# Patient Record
Sex: Female | Born: 1955 | Race: White | Hispanic: No | Marital: Single | Smoking: Never smoker
Health system: Southern US, Community
[De-identification: ages and names within clinical notes are randomized; demographics above are authoritative.]

## PROBLEM LIST (undated history)

## (undated) DIAGNOSIS — M810 Age-related osteoporosis without current pathological fracture: Secondary | ICD-10-CM

## (undated) HISTORY — DX: Age-related osteoporosis without current pathological fracture: M81.0

## (undated) HISTORY — PX: EXPLORATORY LAPAROTOMY: SUR591

---

## 2006-11-26 ENCOUNTER — Ambulatory Visit: Payer: Self-pay | Admitting: Internal Medicine

## 2006-11-26 LAB — CONVERTED CEMR LAB: Sed Rate: 11 mm/hr (ref 0–25)

## 2006-11-27 ENCOUNTER — Ambulatory Visit: Payer: Self-pay

## 2007-01-29 ENCOUNTER — Ambulatory Visit: Payer: Self-pay | Admitting: Internal Medicine

## 2007-04-05 ENCOUNTER — Ambulatory Visit: Payer: Self-pay | Admitting: Specialist

## 2007-11-23 ENCOUNTER — Emergency Department: Payer: Self-pay | Admitting: Emergency Medicine

## 2008-03-15 ENCOUNTER — Emergency Department: Payer: Self-pay | Admitting: Internal Medicine

## 2008-07-04 ENCOUNTER — Ambulatory Visit: Payer: Self-pay | Admitting: Family Medicine

## 2010-01-14 ENCOUNTER — Encounter: Admission: RE | Admit: 2010-01-14 | Discharge: 2010-01-14 | Payer: Self-pay | Admitting: Unknown Physician Specialty

## 2010-12-06 NOTE — Assessment & Plan Note (Signed)
[facility]                             PULMONARY OFFICE NOTE   NAME:CYKERTSuhani, [name]                         MRN:          [id number]  DATE:01/29/2007                            DOB:          September 05, 1955    HISTORY OF PRESENT ILLNESS:  The patient is a 55 year old white female  patient of Dr. Thurston Hole recently seen for pulmonary consult for a left  pleural effusion.  The patient has a history of severe endometriosis,  previously on hormone replacement therapy that was seen in May of this  year for an abrupt onset of left pleuritic pain associated with  shortness of breath.  A chest x-ray showed a small left pleural  effusion.  The patient was seen here in the office on Nov 26, 2006, by  Dr. Sherene Sires.  Chest x-ray at that time showed no evidence of a significant  effusion.  Lab work revealed a normal sed rate at 11.  Venous Dopplers  of the lower extremities showed no evidence of a DVT.  There was some  mild left popliteal venous incompetence.  The patient returns today  reporting that over the weekend, that he had a pulling discomfort along  the left knee that has totally resolved at present.  The patient reports  she is very active, runs, does the elliptical and bikes on most days of  the week.  She denies any associated shortness of breath, chest pain,  calf pain, swelling, abdominal pain, nausea, vomiting, dizziness,  palpitations.  The patient was concerned that her knee was hurting and  wanted to make sure that she had not dissolved the blood clot.   PAST MEDICAL HISTORY:  Reviewed.   CURRENT MEDICATIONS:  Reviewed.   PHYSICAL EXAMINATION:  The patient is a pleasant female patient in no  acute distress.  She is afebrile with stable vital signs.  O2 saturation is 100% on room  air.  HEENT:  Nasal mucosa is pink and moist.  Posterior pharynx is clear.  TMs are normal.  NECK:  Supple without cervical adenopathy.  No JVD.  LUNG SOUNDS:  Clear to  auscultation bilaterally.  CARDIAC:  S1, S2 without murmur, rub, or gallop.  ABDOMEN:  Soft, nontender.  EXTREMITIES:  Warm without any calf tenderness, cyanosis, clubbing, or  edema.  Negative Homan sign.  Knee exam is unremarkable.  Range of  motion is normal.  No excessive fluid is noted.  Posterior popliteal  space without any notable swelling, tenderness.  The patient does have a  few small varicosities along the lower extremities.   IMPRESSION AND PLAN:  Left posterior knee pain, questionable etiology,  possibly muscular in nature.  I recommend that the patient use Tylenol  and/or ibuprofen as tolerated for 2-3 days.  Ice and elevation p.r.n.  If symptoms persist, the patient is to call back the office for followup  or contact her primary care physician for followup.  The patient does  not appear to have any other  associated symptoms; however, I told the patient if she develops any  chest pain, shortness of breath,  she is to contract our office for  followup or seek emergency services assistance.      Rubye Oaks, NP  Electronically Signed      Charlaine Dalton. Sherene Sires, MD, [facility]  Electronically Signed   TP/MedQ  DD: 02/01/2007  DT: 02/02/2007  Job #: [id number]
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
## 2011-09-20 DIAGNOSIS — N200 Calculus of kidney: Secondary | ICD-10-CM | POA: Insufficient documentation

## 2011-09-20 DIAGNOSIS — M810 Age-related osteoporosis without current pathological fracture: Secondary | ICD-10-CM | POA: Insufficient documentation

## 2011-09-20 HISTORY — DX: Calculus of kidney: N20.0

## 2014-04-22 DIAGNOSIS — M810 Age-related osteoporosis without current pathological fracture: Secondary | ICD-10-CM | POA: Insufficient documentation

## 2014-09-03 LAB — HM PAP SMEAR: HM Pap smear: NORMAL

## 2015-02-17 ENCOUNTER — Encounter: Payer: Self-pay | Admitting: Family Medicine

## 2015-02-17 ENCOUNTER — Ambulatory Visit (INDEPENDENT_AMBULATORY_CARE_PROVIDER_SITE_OTHER): Payer: BLUE CROSS/BLUE SHIELD | Admitting: Family Medicine

## 2015-02-17 VITALS — BP 118/82 | HR 58 | Temp 98.0°F | Ht 67.0 in | Wt 138.6 lb

## 2015-02-17 DIAGNOSIS — L237 Allergic contact dermatitis due to plants, except food: Secondary | ICD-10-CM | POA: Diagnosis not present

## 2015-02-17 MED ORDER — DESONIDE 0.05 % EX CREA: TOPICAL_CREAM | Freq: Two times a day (BID) | CUTANEOUS | Status: DC

## 2015-02-17 MED ORDER — TRIAMCINOLONE ACETONIDE 0.1 % EX CREA: 1.0000 "application " | TOPICAL_CREAM | Freq: Two times a day (BID) | CUTANEOUS | Status: DC

## 2015-02-17 NOTE — Progress Notes (Signed)
  BP 118/82 mmHg  Pulse 58  Temp(Src) 98 F (36.7 C)  Ht  (1.702 m)  Wt 138 lb 9.6 oz (62.869 kg)  BMI 21.70 kg/m2  SpO2 99%   Subjective:    Patient ID: [name], female    DOB: Jun 02, 1956, 59 y.o.   MRN: [id number]  HPI: [name] is a 59 y.o. female  Chief Complaint  Patient presents with  . Rash    on face and neck.    RASH Duration:  yesterday  Location: trunk and face  Itching: yes Burning: no Redness: yes Oozing: no Scaling: no Blisters: no Painful: no Fevers: no Change in detergents/soaps/personal care products: no Recent illness: no Recent travel:no History of same: yes Context: stable Alleviating factors: hydrocortisone cream Treatments attempted:hydrocortisone cream and lotion/moisturizer Shortness of breath: no  Throat/tongue swelling: no Myalgias/arthralgias: no  Relevant past medical, surgical, family and social history reviewed and updated as indicated. Interim medical history since our last visit reviewed. Allergies and medications reviewed and updated.  Review of Systems  Constitutional: Negative.   Respiratory: Negative.   Cardiovascular: Negative.   Skin: Negative.   Psychiatric/Behavioral: Negative.     Per HPI unless specifically indicated above     Objective:    BP 118/82 mmHg  Pulse 58  Temp(Src) 98 F (36.7 C)  Ht  (1.702 m)  Wt 138 lb 9.6 oz (62.869 kg)  BMI 21.70 kg/m2  SpO2 99%  Wt Readings from Last 3 Encounters:  02/17/15 138 lb 9.6 oz (62.869 kg)  10/15/14 142 lb (64.411 kg)    Physical Exam  Constitutional: She is oriented to person, place, and time. She appears well-developed and well-nourished. No distress.  HENT:  Head: Normocephalic and atraumatic.  Right Ear: Hearing normal.  Left Ear: Hearing normal.  Nose: Nose normal.  Eyes: Conjunctivae and lids are normal. Right eye exhibits no discharge. Left eye exhibits no discharge. No scleral icterus.  Pulmonary/Chest: Effort normal. No  respiratory distress.  Musculoskeletal: Normal range of motion.  Neurological: She is alert and oriented to person, place, and time.  Skin: Skin is warm, dry and intact. No erythema. No pallor.  Maculopapular rash on the legs, face and anterior chest  Psychiatric: She has a normal mood and affect. Her speech is normal and behavior is normal. Judgment and thought content normal. Cognition and memory are normal.    Results for orders placed or performed in visit on 11/26/06  Converted [facility] Lab  Result Value Ref Range   Sed Rate 11 0-25 mm/hr      Assessment & Plan:   Problem List Items Addressed This Visit    None    Visit Diagnoses    Poison ivy    -  Primary    Mild case. Will treat with topical steroids. Call if not getting better or getting worse.         Follow up plan: Return if symptoms worsen or fail to improve.

## 2015-02-17 NOTE — Patient Instructions (Signed)

## 2015-05-28 ENCOUNTER — Telehealth: Payer: Self-pay | Admitting: Family Medicine

## 2015-05-28 ENCOUNTER — Ambulatory Visit (INDEPENDENT_AMBULATORY_CARE_PROVIDER_SITE_OTHER): Payer: BLUE CROSS/BLUE SHIELD | Admitting: Family Medicine

## 2015-05-28 ENCOUNTER — Encounter: Payer: Self-pay | Admitting: Family Medicine

## 2015-05-28 VITALS — BP 129/84 | HR 75 | Temp 98.5°F | Ht 67.0 in | Wt 147.0 lb

## 2015-05-28 DIAGNOSIS — S39012A Strain of muscle, fascia and tendon of lower back, initial encounter: Secondary | ICD-10-CM | POA: Insufficient documentation

## 2015-05-28 DIAGNOSIS — S46811A Strain of other muscles, fascia and tendons at shoulder and upper arm level, right arm, initial encounter: Secondary | ICD-10-CM

## 2015-05-28 DIAGNOSIS — S161XXA Strain of muscle, fascia and tendon at neck level, initial encounter: Secondary | ICD-10-CM

## 2015-05-28 DIAGNOSIS — S134XXA Sprain of ligaments of cervical spine, initial encounter: Secondary | ICD-10-CM | POA: Diagnosis not present

## 2015-05-28 MED ORDER — CYCLOBENZAPRINE HCL 10 MG PO TABS: 10.0000 mg | ORAL_TABLET | Freq: Three times a day (TID) | ORAL | Status: DC | PRN

## 2015-05-28 MED ORDER — NAPROXEN 375 MG PO TABS: 375.0000 mg | ORAL_TABLET | Freq: Two times a day (BID) | ORAL | Status: DC

## 2015-05-28 NOTE — Telephone Encounter (Signed)
Okay to write note Excuse for work tonight and possibly tomorrow
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
## 2015-05-28 NOTE — Telephone Encounter (Signed)
Pt called needs a work excuse for tonight as well as it being her choice to go into work tomorrow. Please call pt when note is ready for pick up.
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
## 2015-05-28 NOTE — Telephone Encounter (Signed)
Routing to provider  

## 2015-05-28 NOTE — Patient Instructions (Addendum)
Do not take any additional NSAIDs while on the prescription NSAID Use the cyclobenzaprine if needed ICE for the first 3 days, 15 minutes at a time, 3-4 times a day; always use cloth between ice and skin After first 3 days, then heat as above Okay for massage therapy for tight muscles Take aspirin one hour or more before the first naproxen of the day If you develop stomach pain, dark stools, worsening heartburn, stop NSAID and seek care right away Call if needed

## 2015-05-28 NOTE — Telephone Encounter (Signed)
Pt scheduled at 9 due to another cancelation

## 2015-05-31 NOTE — Telephone Encounter (Signed)
Letter done, patient notified   
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
## 2015-06-01 NOTE — Assessment & Plan Note (Signed)
Ice for first three days, then heat; muscle relaxants (cautions given), massage therapy, anti-inflammatories

## 2015-06-01 NOTE — Assessment & Plan Note (Signed)
With loss of cervical lordosis; will treat with muscle relaxers (caution given about not driving, may cause somnolence), anti-inflammatories; physical therapy

## 2015-06-01 NOTE — Assessment & Plan Note (Addendum)
Anti-inflammatories, topical thermal treatment (ice then heat), massage therapy may be helpful; contact me if not improving

## 2015-10-04 ENCOUNTER — Ambulatory Visit (INDEPENDENT_AMBULATORY_CARE_PROVIDER_SITE_OTHER): Payer: BLUE CROSS/BLUE SHIELD | Admitting: Family Medicine

## 2015-10-04 ENCOUNTER — Encounter: Payer: Self-pay | Admitting: Family Medicine

## 2015-10-04 VITALS — BP 126/83 | HR 64 | Temp 98.1°F | Ht 67.3 in | Wt 142.0 lb

## 2015-10-04 DIAGNOSIS — J069 Acute upper respiratory infection, unspecified: Secondary | ICD-10-CM | POA: Diagnosis not present

## 2015-10-04 DIAGNOSIS — M545 Low back pain, unspecified: Secondary | ICD-10-CM

## 2015-10-04 DIAGNOSIS — S39012A Strain of muscle, fascia and tendon of lower back, initial encounter: Secondary | ICD-10-CM | POA: Diagnosis not present

## 2015-10-04 LAB — URINALYSIS, ROUTINE W REFLEX MICROSCOPIC
Bilirubin, UA: NEGATIVE
Glucose, UA: NEGATIVE
Ketones, UA: NEGATIVE
Leukocytes, UA: NEGATIVE
Nitrite, UA: NEGATIVE
Protein, UA: NEGATIVE
Specific Gravity, UA: 1.02 (ref 1.005–1.030)
Urobilinogen, Ur: 0.2 mg/dL (ref 0.2–1.0)
pH, UA: 5.5 (ref 5.0–7.5)

## 2015-10-04 LAB — MICROSCOPIC EXAMINATION: WBC, UA: NONE SEEN /hpf (ref 0–?)

## 2015-10-04 NOTE — Progress Notes (Signed)
BP 126/83 mmHg  Pulse 64  Temp(Src) 98.1 F (36.7 C)  Ht 5' 7.3" (1.709 m)  Wt 142 lb (64.411 kg)  BMI 22.05 kg/m2  SpO2 99%   Subjective:    Patient ID: [name], female    DOB: 08/24/1955, 60 y.o.   MRN: [id number]  HPI: [name] is a 60 y.o. female  Chief Complaint  Patient presents with  . Back Pain    low, left side    Patient with flu symptoms started Tamiflu 2 days ago and is already feeling somewhat better but developed left lower back discomfort pain no radiation no rash in this area. No blood in stool or urine Concerned though may be some kidney related though has been related to development of cough and has had some pretty heavy coughing.  Relevant past medical, surgical, family and social history reviewed and updated as indicated. Interim medical history since our last visit reviewed. Allergies and medications reviewed and updated.  Review of Systems  Constitutional: Negative.   Respiratory: Negative.   Cardiovascular: Negative.     Per HPI unless specifically indicated above     Objective:    BP 126/83 mmHg  Pulse 64  Temp(Src) 98.1 F (36.7 C)  Ht 5' 7.3" (1.709 m)  Wt 142 lb (64.411 kg)  BMI 22.05 kg/m2  SpO2 99%  Wt Readings from Last 3 Encounters:  10/04/15 142 lb (64.411 kg)  05/28/15 147 lb (66.679 kg)  02/17/15 138 lb 9.6 oz (62.869 kg)    Physical Exam  Constitutional: She is oriented to person, place, and time. She appears well-developed and well-nourished. No distress.  HENT:  Head: Normocephalic and atraumatic.  Right Ear: Hearing and external ear normal.  Left Ear: Hearing and external ear normal.  Nose: Nose normal.  Mouth/Throat: Oropharyngeal exudate present.  Eyes: Conjunctivae and lids are normal. Right eye exhibits no discharge. Left eye exhibits no discharge. No scleral icterus.  Neck: No thyromegaly present.  Cardiovascular: Normal rate, regular rhythm and normal heart sounds.   Pulmonary/Chest: Effort normal  and breath sounds normal. No respiratory distress.  Musculoskeletal: Normal range of motion.  Lymphadenopathy:    She has no cervical adenopathy.  Neurological: She is alert and oriented to person, place, and time.  Skin: Skin is intact. No rash noted.  Psychiatric: She has a normal mood and affect. Her speech is normal and behavior is normal. Judgment and thought content normal. Cognition and memory are normal.    Results for orders placed or performed in visit on 11/26/06  Converted [facility] Lab  Result Value Ref Range   Sed Rate 11 0-25 mm/hr      Assessment & Plan:   Problem List Items Addressed This Visit      Musculoskeletal and Integument   Sacroiliac strain    Discussed strain of left flank is likely from coughing this patient suspects discussed watching for rash with concern about shingles also with 2+ blood in urine will keep open mind about possibility of kidney stone.       Other Visit Diagnoses    Left-sided low back pain without sciatica    -  Primary    Relevant Orders    Urinalysis, Routine w reflex microscopic (not at [facility])    Upper respiratory infection        Improving with Tamiflu will continue    Relevant Medications    oseltamivir (TAMIFLU) 75 MG capsule        Follow up plan:  Return for As scheduled.

## 2015-10-04 NOTE — Assessment & Plan Note (Signed)
Discussed strain of left flank is likely from coughing this patient suspects discussed watching for rash with concern about shingles also with 2+ blood in urine will keep open mind about possibility of kidney stone.

## 2015-12-06 ENCOUNTER — Encounter: Payer: Self-pay | Admitting: Family Medicine

## 2015-12-06 ENCOUNTER — Ambulatory Visit (INDEPENDENT_AMBULATORY_CARE_PROVIDER_SITE_OTHER): Payer: BLUE CROSS/BLUE SHIELD | Admitting: Family Medicine

## 2015-12-06 VITALS — BP 114/76 | HR 62 | Temp 97.8°F | Ht 66.8 in | Wt 144.0 lb

## 2015-12-06 DIAGNOSIS — J0100 Acute maxillary sinusitis, unspecified: Secondary | ICD-10-CM

## 2015-12-06 MED ORDER — AMOXICILLIN-POT CLAVULANATE 875-125 MG PO TABS: 1.0000 | ORAL_TABLET | Freq: Two times a day (BID) | ORAL | Status: DC

## 2015-12-06 NOTE — Progress Notes (Signed)
BP 114/76 mmHg  Pulse 62  Temp(Src) 97.8 F (36.6 C)  Ht 5' 6.8" (1.697 m)  Wt 144 lb (65.318 kg)  BMI 22.68 kg/m2  SpO2 99%   Subjective:    Patient ID: [name], female    DOB: 25-Aug-1955, 60 y.o.   MRN: [id number]  HPI: [name] is a 60 y.o. female  Chief Complaint  Patient presents with  . Facial Pain    right side  Patient with several weeks of sinus pressure congestion and facial pain especially on right side into right upper molars concerned may possibly have some dental abscess issues going on but has more sloshing-type sensation when bends over with right facial maxillary sinus pain. Some low-grade fever no real body aches maybe some chills Aching ibuprofen and Tylenol.  Relevant past medical, surgical, family and social history reviewed and updated as indicated. Interim medical history since our last visit reviewed. Allergies and medications reviewed and updated.  Review of Systems  Constitutional: Positive for chills and fatigue. Negative for fever.  HENT: Positive for congestion, rhinorrhea, sinus pressure, sneezing and sore throat.   Respiratory: Negative.   Cardiovascular: Negative.     Per HPI unless specifically indicated above     Objective:    BP 114/76 mmHg  Pulse 62  Temp(Src) 97.8 F (36.6 C)  Ht 5' 6.8" (1.697 m)  Wt 144 lb (65.318 kg)  BMI 22.68 kg/m2  SpO2 99%  Wt Readings from Last 3 Encounters:  12/06/15 144 lb (65.318 kg)  10/04/15 142 lb (64.411 kg)  05/28/15 147 lb (66.679 kg)    Physical Exam  Constitutional: She is oriented to person, place, and time. She appears well-developed and well-nourished. No distress.  HENT:  Head: Normocephalic and atraumatic.  Right Ear: Hearing and external ear normal.  Left Ear: Hearing and external ear normal.  Nose: Nose normal.  Mouth/Throat: Oropharyngeal exudate present.  Eyes: Conjunctivae and lids are normal. Right eye exhibits no discharge. Left eye exhibits no discharge. No  scleral icterus.  Cardiovascular: Normal rate, regular rhythm and normal heart sounds.   Pulmonary/Chest: Effort normal and breath sounds normal. No respiratory distress.  Musculoskeletal: Normal range of motion.  Lymphadenopathy:    She has no cervical adenopathy.  Neurological: She is alert and oriented to person, place, and time.  Skin: Skin is intact. No rash noted.  Psychiatric: She has a normal mood and affect. Her speech is normal and behavior is normal. Judgment and thought content normal. Cognition and memory are normal.    Results for orders placed or performed in visit on 10/04/15  Microscopic Examination  Result Value Ref Range   WBC, UA None seen 0 -  5 /hpf   RBC, UA 3-10 (A) 0 -  2 /hpf   Epithelial Cells (non renal) 0-10 0 - 10 /hpf   Bacteria, UA Few None seen/Few  Urinalysis, Routine w reflex microscopic (not at [facility])  Result Value Ref Range   Specific Gravity, UA 1.020 1.005 - 1.030   pH, UA 5.5 5.0 - 7.5   Color, UA Yellow Yellow   Appearance Ur Clear Clear   Leukocytes, UA Negative Negative   Protein, UA Negative Negative/Trace   Glucose, UA Negative Negative   Ketones, UA Negative Negative   RBC, UA 2+ (A) Negative   Bilirubin, UA Negative Negative   Urobilinogen, Ur 0.2 0.2 - 1.0 mg/dL   Nitrite, UA Negative Negative   Microscopic Examination See below:  Assessment & Plan:   Problem List Items Addressed This Visit    None    Visit Diagnoses    Acute maxillary sinusitis, recurrence not specified    -  Primary    Discussed sinusitis care and treatment nasal rinse, Mucinex, Flonase, Nasacort, Tylenol sinus etc.    Relevant Medications    amoxicillin-clavulanate (AUGMENTIN) 875-125 MG tablet      Also discuss possibility of dental abscess which patient is aware of if teeth and jaw pain persists will follow up with Maurine Ministerennis.  Follow up plan: Return if symptoms worsen or fail to improve, for As scheduled.

## 2015-12-15 ENCOUNTER — Telehealth: Payer: Self-pay | Admitting: Family Medicine

## 2015-12-15 MED ORDER — AMOXICILLIN-POT CLAVULANATE 875-125 MG PO TABS: 1.0000 | ORAL_TABLET | Freq: Two times a day (BID) | ORAL | Status: DC

## 2015-12-15 NOTE — Telephone Encounter (Signed)
Having dental work on June 6, would like ABX

## 2015-12-15 NOTE — Telephone Encounter (Signed)
Pt called would like a call back from [facility] or Dr. Dossie Arbourrissman ASAP concerning her sinus infection. Thanks.

## 2016-05-23 ENCOUNTER — Telehealth: Payer: BLUE CROSS/BLUE SHIELD | Admitting: Physician Assistant

## 2016-05-23 DIAGNOSIS — R399 Unspecified symptoms and signs involving the genitourinary system: Secondary | ICD-10-CM

## 2016-05-23 MED ORDER — NITROFURANTOIN MONOHYD MACRO 100 MG PO CAPS: 100.0000 mg | ORAL_CAPSULE | Freq: Two times a day (BID) | ORAL | 0 refills | Status: DC

## 2016-05-23 NOTE — Progress Notes (Signed)

## 2016-07-06 ENCOUNTER — Encounter: Payer: Self-pay | Admitting: Family Medicine

## 2016-07-06 ENCOUNTER — Ambulatory Visit (INDEPENDENT_AMBULATORY_CARE_PROVIDER_SITE_OTHER): Payer: BLUE CROSS/BLUE SHIELD | Admitting: Family Medicine

## 2016-07-06 VITALS — BP 110/70 | HR 59 | Temp 98.1°F | Ht 66.0 in | Wt 149.6 lb

## 2016-07-06 DIAGNOSIS — N3001 Acute cystitis with hematuria: Secondary | ICD-10-CM

## 2016-07-06 DIAGNOSIS — J019 Acute sinusitis, unspecified: Secondary | ICD-10-CM

## 2016-07-06 DIAGNOSIS — R399 Unspecified symptoms and signs involving the genitourinary system: Secondary | ICD-10-CM | POA: Diagnosis not present

## 2016-07-06 LAB — MICROSCOPIC EXAMINATION
Epithelial Cells (non renal): NONE SEEN /hpf (ref 0–10)
WBC, UA: >30 /hpf — AB (ref 0–?)

## 2016-07-06 LAB — URINALYSIS, ROUTINE W REFLEX MICROSCOPIC
Bilirubin, UA: NEGATIVE
Ketones, UA: NEGATIVE
Nitrite, UA: POSITIVE — AB
Specific Gravity, UA: <1.005 — ABNORMAL LOW (ref 1.005–1.030)
Urobilinogen, Ur: 2 mg/dL — ABNORMAL HIGH (ref 0.2–1.0)
pH, UA: 5 (ref 5.0–7.5)

## 2016-07-06 MED ORDER — AMOXICILLIN 875 MG PO TABS: 875.0000 mg | ORAL_TABLET | Freq: Two times a day (BID) | ORAL | 0 refills | Status: DC

## 2016-07-06 NOTE — Progress Notes (Signed)
BP 110/70 (BP Location: Left Arm, Patient Position: Sitting, Cuff Size: Normal)   Pulse (!) 59   Temp 98.1 F (36.7 C)   Ht 5\' 6"  (1.676 m)   Wt 149 lb 9.6 oz (67.9 kg)   BMI 24.15 kg/m    Subjective:    Patient ID: [name], female    DOB: [birth year], 60 y.o.   MRN: [id number]  HPI: [name] is a 60 y.o. female  Chief Complaint  Patient presents with  . UTI Symptoms   Patient with frequency urgency dysuria is going on for over a day now. Started Azo which has helped somewhat strained her urine orange as expected. Patient also over the last 2 weeks his had some sinus had cold drainage used over-the-counter medications has kind of been getting better but still is present and ongoing. Relevant past medical, surgical, family and social history reviewed and updated as indicated. Interim medical history since our last visit reviewed. Allergies and medications reviewed and updated.  Review of Systems  Constitutional: Negative.   Respiratory: Negative.   Cardiovascular: Negative.     Per HPI unless specifically indicated above     Objective:    BP 110/70 (BP Location: Left Arm, Patient Position: Sitting, Cuff Size: Normal)   Pulse (!) 59   Temp 98.1 F (36.7 C)   Ht 5\' 6"  (1.676 m)   Wt 149 lb 9.6 oz (67.9 kg)   BMI 24.15 kg/m   Wt Readings from Last 3 Encounters:  07/06/16 149 lb 9.6 oz (67.9 kg)  12/06/15 144 lb (65.3 kg)  10/04/15 142 lb (64.4 kg)    Physical Exam  Constitutional: She is oriented to person, place, and time. She appears well-developed and well-nourished. No distress.  HENT:  Head: Normocephalic and atraumatic.  Right Ear: Hearing normal.  Left Ear: Hearing normal.  Nose: Nose normal.  Eyes: Conjunctivae and lids are normal. Right eye exhibits no discharge. Left eye exhibits no discharge. No scleral icterus.  Pulmonary/Chest: Effort normal. No respiratory distress.  Musculoskeletal: Normal range of motion.  Neurological: She is alert  and oriented to person, place, and time.  Skin: Skin is intact. No rash noted.  Psychiatric: She has a normal mood and affect. Her speech is normal and behavior is normal. Judgment and thought content normal. Cognition and memory are normal.    Results for orders placed or performed in visit on 10/04/15  Microscopic Examination  Result Value Ref Range   WBC, UA None seen 0 - 5 /hpf   RBC, UA 3-10 (A) 0 - 2 /hpf   Epithelial Cells (non renal) 0-10 0 - 10 /hpf   Bacteria, UA Few None seen/Few  Urinalysis, Routine w reflex microscopic ([facility])  Result Value Ref Range   Specific Gravity, UA 1.020 1.005 - 1.030   pH, UA 5.5 5.0 - 7.5   Color, UA Yellow Yellow   Appearance Ur Clear Clear   Leukocytes, UA Negative Negative   Protein, UA Negative Negative/Trace   Glucose, UA Negative Negative   Ketones, UA Negative Negative   RBC, UA 2+ (A) Negative   Bilirubin, UA Negative Negative   Urobilinogen, Ur 0.2 0.2 - 1.0 mg/dL   Nitrite, UA Negative Negative   Microscopic Examination See below:       Assessment & Plan:   Problem List Items Addressed This Visit    None    Visit Diagnoses    UTI symptoms    -  Primary  Relevant Orders   Urinalysis, Routine w reflex microscopic   Acute cystitis with hematuria       Discuss UTI care and treatment also sinusitis Will use Amoxil patient education continue OTC meds follow-up if needed   Acute sinusitis, recurrence not specified, unspecified location       Will treat for sinus infection also using Amoxil and over-the-counter medications.   Relevant Medications   amoxicillin (AMOXIL) 875 MG tablet       Follow up plan: Return if symptoms worsen or fail to improve.

## 2017-10-31 ENCOUNTER — Encounter: Payer: Self-pay | Admitting: Family Medicine

## 2017-10-31 ENCOUNTER — Ambulatory Visit: Payer: BLUE CROSS/BLUE SHIELD | Admitting: Family Medicine

## 2017-10-31 VITALS — BP 117/63 | HR 79 | Temp 98.3°F | Ht 66.0 in | Wt 156.2 lb

## 2017-10-31 DIAGNOSIS — J020 Streptococcal pharyngitis: Secondary | ICD-10-CM | POA: Diagnosis not present

## 2017-10-31 LAB — RAPID STREP SCREEN (MED CTR MEBANE ONLY): Strep Gp A Ag, IA W/Reflex: POSITIVE — AB

## 2017-10-31 MED ORDER — AMOXICILLIN-POT CLAVULANATE 875-125 MG PO TABS: 1.0000 | ORAL_TABLET | Freq: Two times a day (BID) | ORAL | 0 refills | Status: DC

## 2017-10-31 MED ORDER — LIDOCAINE VISCOUS 2 % MT SOLN: 5.0000 mL | OROMUCOSAL | 0 refills | Status: DC | PRN

## 2017-10-31 NOTE — Progress Notes (Signed)
   BP 117/63   Pulse 79   Temp 98.3 F (36.8 C) (Oral)   Ht 5\' 6"  (1.676 m)   Wt 156 lb 3.2 oz (70.9 kg)   SpO2 97%   BMI 25.21 kg/m    Subjective:    Patient ID: Concha SeLinda Harren, female    DOB: March 10, 1956, 62 y.o.   MRN: [id number]  HPI: Concha SeLinda Blancett is a 62 y.o. female  Chief Complaint  Patient presents with  . Sore Throat   Several days of sore throat, fever (102.8), fatigue. Denies cough, congestion, CP, SOB. Taking some ibuprofen and drinking lots of fluids . Works with children, several sick contacts lately.   Relevant past medical, surgical, family and social history reviewed and updated as indicated. Interim medical history since our last visit reviewed. Allergies and medications reviewed and updated.  Review of Systems  Per HPI unless specifically indicated above     Objective:    BP 117/63   Pulse 79   Temp 98.3 F (36.8 C) (Oral)   Ht 5\' 6"  (1.676 m)   Wt 156 lb 3.2 oz (70.9 kg)   SpO2 97%   BMI 25.21 kg/m   Wt Readings from Last 3 Encounters:  10/31/17 156 lb 3.2 oz (70.9 kg)  07/06/16 149 lb 9.6 oz (67.9 kg)  12/06/15 144 lb (65.3 kg)    Physical Exam  Constitutional: She is oriented to person, place, and time. She appears well-developed and well-nourished. No distress.  HENT:  Head: Atraumatic.  Right Ear: Tympanic membrane normal.  Left Ear: Tympanic membrane normal.  Mouth/Throat: Mucous membranes are normal. Posterior oropharyngeal edema and posterior oropharyngeal erythema present.  Eyes: Pupils are equal, round, and reactive to light. EOM are normal.  Neck: Normal range of motion. Neck supple.  Cardiovascular: Normal rate and normal heart sounds.  Abdominal: Soft. Bowel sounds are normal.  Lymphadenopathy:    She has cervical adenopathy.  Neurological: She is alert and oriented to person, place, and time.  Skin: Skin is warm and dry.  Psychiatric: She has a normal mood and affect. Her behavior is normal.  Nursing note and vitals  reviewed.   Results for orders placed or performed in visit on 10/31/17  Rapid Strep Screen ([facility])  Result Value Ref Range   Strep Gp A Ag, IA W/Reflex Positive (A) Negative      Assessment & Plan:   Problem List Items Addressed This Visit    None    Visit Diagnoses    Strep pharyngitis    -  Primary   Rapid strep pos, will tx with augmentin, viscous lidocaine, supportive care with salt water gargles, tylenol and ibuprofen prn. F/u if no improvement   Relevant Orders   Rapid Strep Screen ([facility] ONLY) (Completed)       Follow up plan: Return if symptoms worsen or fail to improve.

## 2017-11-03 NOTE — Patient Instructions (Signed)
Follow up as needed

## 2017-11-16 ENCOUNTER — Encounter: Payer: Self-pay | Admitting: Family Medicine

## 2017-11-16 ENCOUNTER — Ambulatory Visit: Payer: BLUE CROSS/BLUE SHIELD | Admitting: Family Medicine

## 2017-11-16 VITALS — BP 100/60 | HR 66 | Temp 98.7°F | Wt 152.1 lb

## 2017-11-16 DIAGNOSIS — Z0184 Encounter for antibody response examination: Secondary | ICD-10-CM | POA: Diagnosis not present

## 2017-11-16 DIAGNOSIS — R635 Abnormal weight gain: Secondary | ICD-10-CM | POA: Diagnosis not present

## 2017-11-16 NOTE — Progress Notes (Signed)
BP 100/60 (BP Location: Right Arm, Patient Position: Sitting, Cuff Size: Normal)   Pulse 66   Temp 98.7 F (37.1 C) (Oral)   Wt 152 lb 1.6 oz (69 kg)   SpO2 98%   BMI 24.55 kg/m    Subjective:    Patient ID: [name], female    DOB: 11-05-1955, 62 y.o.   MRN: [id number]  HPI: [name] is a 62 y.o. female  Chief Complaint  Patient presents with  . MMR Vaccination    Patient states she thinks she had MMR's as a child, but was told to get another because she's around children.  . Weight Gain    Patient wants to check her TSH because of her weight.   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 Thinks she had her vaccines as a child but not sure. Asymptomatic,   Has gained some weight over the past few years, despite exercising and eating well. Does feel like her diet may not be as good as it used to be but for the most part eats healthily. Worried about a thyroid issue. No past hx of thyroid issues. Denies any other sxs such as fatigue, constipation, depression, cold intolerance.   Past Medical History:  Diagnosis Date  . Osteoporosis    Social History   Socioeconomic History  . Marital status: Single    Spouse name: Not on file  . Number of children: Not on file  . Years of education: Not on file  . Highest education level: Not on file  Occupational History  . Not on file  Social Needs  . Financial resource strain: Not on file  . Food insecurity:    Worry: Not on file    Inability: Not on file  . Transportation needs:    Medical: Not on file    Non-medical: Not on file  Tobacco Use  . Smoking status: Never Smoker  . Smokeless tobacco: Never Used  Substance and Sexual Activity  . Alcohol use: No  . Drug use: No  . Sexual activity: Never  Lifestyle  . Physical activity:    Days per week: Not on file    Minutes  per session: Not on file  . Stress: Not on file  Relationships  . Social connections:    Talks on phone: Not on file    Gets together: Not on file    Attends religious service: Not on file    Active member of club or organization: Not on file    Attends meetings of clubs or organizations: Not on file    Relationship status: Not on file  . Intimate partner violence:    Fear of current or ex partner: Not on file    Emotionally abused: Not on file    Physically abused: Not on file    Forced sexual activity: Not on file  Other Topics Concern  . Not on file  Social History Narrative  . Not on file   Relevant past medical, surgical, family and social history reviewed and updated as indicated. Interim medical history since our last visit reviewed. Allergies and medications reviewed and updated.  Review of Systems  Per HPI unless specifically indicated above     Objective:    BP 100/60 (BP Location: Right Arm, Patient Position: Sitting, Cuff Size: Normal)  Pulse 66   Temp 98.7 F (37.1 C) (Oral)   Wt 152 lb 1.6 oz (69 kg)   SpO2 98%   BMI 24.55 kg/m   Wt Readings from Last 3 Encounters:  11/16/17 152 lb 1.6 oz (69 kg)  10/31/17 156 lb 3.2 oz (70.9 kg)  07/06/16 149 lb 9.6 oz (67.9 kg)    Physical Exam  Constitutional: She is oriented to person, place, and time. She appears well-developed and well-nourished. No distress.  HENT:  Head: Atraumatic.  Eyes: Pupils are equal, round, and reactive to light. Conjunctivae are normal.  Neck: Normal range of motion. Neck supple.  Cardiovascular: Normal rate and regular rhythm.  Pulmonary/Chest: Effort normal and breath sounds normal.  Musculoskeletal: Normal range of motion.  Neurological: She is alert and oriented to person, place, and time.  Skin: Skin is warm and dry.  Psychiatric: She has a normal mood and affect. Her behavior is normal.  Nursing note and vitals reviewed.   Results for orders placed or performed in visit on  11/16/17  Measles/Mumps/Rubella Immunity  Result Value Ref Range   Rubella Antibodies, IGG 8.00 Immune >0.99 index   RUBEOLA AB, IGG >300.0 Immune >29.9 AU/mL   MUMPS ABS, IGG 227.0 Immune >10.9 AU/mL  TSH  Result Value Ref Range   TSH 1.260 0.450 - 4.500 uIU/mL      Assessment & Plan:   Problem List Items Addressed This Visit    None    Visit Diagnoses    Weight gain    -  Primary   Will check TSH today for reassurance, but discussed with pt healthy BMI and minimal weight gain. Cont healthy lifestyle   Relevant Orders   TSH (Completed)   Immunity status testing       Will check titers for MMR immunity and vaccinate as indicated   Relevant Orders   Measles/Mumps/Rubella Immunity (Completed)      Follow up plan: Return for as scheduled.

## 2017-11-17 LAB — MEASLES/MUMPS/RUBELLA IMMUNITY
MUMPS ABS, IGG: 227 AU/mL (ref >10.9)
RUBEOLA AB, IGG: >300 AU/mL (ref >29.9)
Rubella Antibodies, IGG: 8 index (ref >0.99)

## 2017-11-17 LAB — TSH: TSH: 1.26 u[IU]/mL (ref 0.450–4.500)

## 2017-11-19 ENCOUNTER — Telehealth: Payer: Self-pay | Admitting: Family Medicine

## 2017-11-19 DIAGNOSIS — Z23 Encounter for immunization: Secondary | ICD-10-CM

## 2017-11-19 NOTE — Telephone Encounter (Signed)
Routing to provider  

## 2017-11-19 NOTE — Addendum Note (Signed)
Addended by: Roosvelt Maser E on: 11/19/2017 04:44 PM   Modules accepted: Orders

## 2017-11-19 NOTE — Patient Instructions (Signed)
Follow up as needed

## 2017-11-19 NOTE — Telephone Encounter (Signed)
Order placed

## 2017-11-19 NOTE — Telephone Encounter (Signed)
Patient called for lab results.  Read results as documented by provider.  Patient stated that she will be coming in for MMR vaccine. Please place order so patient does not have to wait when she decides to come in.

## 2017-12-12 ENCOUNTER — Ambulatory Visit: Payer: Self-pay | Admitting: *Deleted

## 2017-12-12 NOTE — Telephone Encounter (Signed)
Pt reports insect bites, working with pine needles this am. Left groin area with 4-5 bites and right area of waistline with "About 30 small, typical looking mosquito bites." Bites are red and itchy, left groin bites are raised "Like welts." Denies any SOB, diffuse redness. Home care advise given  per protocol. Instructed pt to call back if symptoms worsen, redness,itching does not resolve within 3 days. Pt made aware NT would alert Dr. Dossie Arbour  of episode.  Reason for Disposition . Itchy insect bite  Answer Assessment - Initial Assessment Questions 1. TYPE of INSECT: "What type of insect was it?"      Unsure 2. ONSET: "When did you get bitten?"      This am, working with pine needles 3. LOCATION: "Where is the insect bite located?"      Left groin and right waist area 4. REDNESS: "Is the area red or pink?" If so, ask "What size is area of redness?" (inches or cm). "When did the redness start?"     Like welts at left groin; "Typical mosquito bites" right waist area. 5. PAIN: "Is there any pain?" If so, ask: "How bad is it?"  (Scale 1-10; or mild, moderate, severe)     no 6. ITCHING: "Does it itch?" If so, ask: "How bad is the itch?"    - MILD: doesn't interfere with normal activities   - MODERATE-SEVERE: interferes with work, school, sleep, or other activities      Moderate-severe. 7. SWELLING: "How big is the swelling?" (inches, cm, or compare to coins)     Like welts at left groin. 8. OTHER SYMPTOMS: "Do you have any other symptoms?"  (e.g., difficulty breathing, hives)     no 9. PREGNANCY: "Is there any chance you are pregnant?" "When was your last menstrual period?"     no  Protocols used: INSECT BITE-A-AH

## 2017-12-19 LAB — HM MAMMOGRAPHY: HM Mammogram: NORMAL (ref 0–4)

## 2018-03-12 ENCOUNTER — Encounter: Payer: Self-pay | Admitting: Family Medicine

## 2018-03-12 ENCOUNTER — Ambulatory Visit: Payer: BLUE CROSS/BLUE SHIELD | Admitting: Family Medicine

## 2018-03-12 VITALS — BP 101/67 | HR 62 | Temp 98.5°F | Wt 153.0 lb

## 2018-03-12 DIAGNOSIS — L959 Vasculitis limited to the skin, unspecified: Secondary | ICD-10-CM

## 2018-03-12 NOTE — Progress Notes (Signed)
   BP 101/67 (BP Location: Left Arm, Patient Position: Sitting, Cuff Size: Normal)   Pulse 62   Temp 98.5 F (36.9 C)   Wt 153 lb (69.4 kg)   SpO2 98%   BMI 24.69 kg/m    Subjective:    Patient ID: [name], female    DOB: 22-Oct-1955, 62 y.o.   MRN: [id number]  HPI: Jeanne SeLinda Todt is a 62 y.o. female  Chief Complaint  Patient presents with  . Rash    bilateral lower legs, X 2 days,appeared after doing yard work, feels like a slight sunburn   Here today for discoloration of b/l lower legs that started about 2 days ago after mowing her lawn. Also happened about 2 weeks ago when mowing, and in the past while she was at the beach. Asymptomatic. Has not tried anything for it when it happens. No known coagulopathies, trauma, bug bites, recent travel, exposures to allergens.   Has a cousin with Factor V Leiden.   Relevant past medical, surgical, family and social history reviewed and updated as indicated. Interim medical history since our last visit reviewed. Allergies and medications reviewed and updated.  Review of Systems  Per HPI unless specifically indicated above     Objective:    BP 101/67 (BP Location: Left Arm, Patient Position: Sitting, Cuff Size: Normal)   Pulse 62   Temp 98.5 F (36.9 C)   Wt 153 lb (69.4 kg)   SpO2 98%   BMI 24.69 kg/m   Wt Readings from Last 3 Encounters:  03/12/18 153 lb (69.4 kg)  11/16/17 152 lb 1.6 oz (69 kg)  10/31/17 156 lb 3.2 oz (70.9 kg)    Physical Exam  Constitutional: She is oriented to person, place, and time. She appears well-developed and well-nourished. No distress.  HENT:  Head: Atraumatic.  Eyes: Conjunctivae and EOM are normal.  Neck: Normal range of motion.  Cardiovascular: Normal rate, regular rhythm, normal heart sounds and intact distal pulses.  Pulmonary/Chest: Effort normal and breath sounds normal.  Musculoskeletal: Normal range of motion.  Neurological: She is alert and oriented to person, place, and time.    Skin: Skin is warm and dry.  Petechial rash b/l lower legs   Psychiatric: She has a normal mood and affect. Her behavior is normal.  Nursing note and vitals reviewed.  Results for orders placed or performed in visit on 11/16/17  Measles/Mumps/Rubella Immunity  Result Value Ref Range   Rubella Antibodies, IGG 8.00 Immune >0.99 index   RUBEOLA AB, IGG >300.0 Immune >29.9 AU/mL   MUMPS ABS, IGG 227.0 Immune >10.9 AU/mL  TSH  Result Value Ref Range   TSH 1.260 0.450 - 4.500 uIU/mL      Assessment & Plan:   Problem List Items Addressed This Visit    None    Visit Diagnoses    Vasculitis of skin    -  Primary   Reassurance given. Will get CBC and CRP, pt concerned about an underlying condition esp given a cousin has Factor V. Elevate legs, NSAIDs prn.    Relevant Orders   CBC with Differential/Platelet   C-reactive protein       Follow up plan: Return for as scheduled.

## 2018-03-12 NOTE — Patient Instructions (Signed)
Follow up as scheduled.  

## 2018-03-13 LAB — CBC WITH DIFFERENTIAL/PLATELET
Basophils Absolute: 0 10*3/uL (ref 0.0–0.2)
Basos: 1 %
EOS (ABSOLUTE): 0.1 10*3/uL (ref 0.0–0.4)
Eos: 3 %
Hematocrit: 38.8 % (ref 34.0–46.6)
Hemoglobin: 13 g/dL (ref 11.1–15.9)
Immature Grans (Abs): 0 10*3/uL (ref 0.0–0.1)
Immature Granulocytes: 0 %
Lymphocytes Absolute: 0.9 10*3/uL (ref 0.7–3.1)
Lymphs: 29 %
MCH: 28.7 pg (ref 26.6–33.0)
MCHC: 33.5 g/dL (ref 31.5–35.7)
MCV: 86 fL (ref 79–97)
Monocytes Absolute: 0.3 10*3/uL (ref 0.1–0.9)
Monocytes: 10 %
Neutrophils Absolute: 1.9 10*3/uL (ref 1.4–7.0)
Neutrophils: 57 %
Platelets: 183 10*3/uL (ref 150–450)
RBC: 4.53 x10E6/uL (ref 3.77–5.28)
RDW: 14 % (ref 12.3–15.4)
WBC: 3.3 10*3/uL — ABNORMAL LOW (ref 3.4–10.8)

## 2018-03-13 LAB — C-REACTIVE PROTEIN: CRP: 2 mg/L (ref 0–10)

## 2018-03-14 ENCOUNTER — Other Ambulatory Visit: Payer: Self-pay | Admitting: Family Medicine

## 2018-03-14 DIAGNOSIS — R7989 Other specified abnormal findings of blood chemistry: Secondary | ICD-10-CM

## 2018-03-15 ENCOUNTER — Telehealth: Payer: Self-pay | Admitting: Family Medicine

## 2018-03-15 NOTE — Telephone Encounter (Signed)
Pt. Called to review her CBC "one more time."

## 2018-03-15 NOTE — Telephone Encounter (Signed)
Patient notified, was happy about all her other normal results. Will return around the first week of September for a repeat lab draw.  Routing to provider as Lorain ChildesFYI.

## 2018-03-15 NOTE — Telephone Encounter (Signed)
See below, I had released them to mychart when I received them. They're non-contributory to her current issue, but just to be safe we can recheck as the WBC count was just under normal limits to make sure it normalizes  Notes recorded by Particia NearingLane, Lataya Varnell Elizabeth, PA-C on 03/14/2018 at 1:24 PM EDT Your labs came back normal other than a slightly low WBC count which probably is unrelated/transient here. Let's have you just come in for a repeat blood test in a few weeks to check that again. I'll have an order in and you can just drop by for a lab draw.  Copied from CRM #[id number]. Topic: General - Other >> Mar 15, 2018 11:24 AM [name], [name] wrote: Pt is calling in wanting her lab results that were taken on  03/12/2018. Pt states she cant take any calls after 12:30pm . She states a detailed message can be left on her voicemail and she needs results by today   Cb# [phone]

## 2018-03-19 NOTE — Telephone Encounter (Signed)
Patient stated that was an old message and patient understands everything.  Will come in for repeat blood draw.

## 2018-04-22 ENCOUNTER — Other Ambulatory Visit: Payer: BLUE CROSS/BLUE SHIELD

## 2018-04-22 DIAGNOSIS — R7989 Other specified abnormal findings of blood chemistry: Secondary | ICD-10-CM

## 2018-04-23 LAB — CBC WITH DIFFERENTIAL/PLATELET
Basophils Absolute: 0 10*3/uL (ref 0.0–0.2)
Basos: 1 %
EOS (ABSOLUTE): 0.1 10*3/uL (ref 0.0–0.4)
Eos: 3 %
Hematocrit: 40 % (ref 34.0–46.6)
Hemoglobin: 13.1 g/dL (ref 11.1–15.9)
Immature Grans (Abs): 0 10*3/uL (ref 0.0–0.1)
Immature Granulocytes: 0 %
Lymphocytes Absolute: 1.4 10*3/uL (ref 0.7–3.1)
Lymphs: 32 %
MCH: 28.9 pg (ref 26.6–33.0)
MCHC: 32.8 g/dL (ref 31.5–35.7)
MCV: 88 fL (ref 79–97)
Monocytes Absolute: 0.3 10*3/uL (ref 0.1–0.9)
Monocytes: 7 %
Neutrophils Absolute: 2.6 10*3/uL (ref 1.4–7.0)
Neutrophils: 57 %
Platelets: 209 10*3/uL (ref 150–450)
RBC: 4.53 x10E6/uL (ref 3.77–5.28)
RDW: 13.9 % (ref 12.3–15.4)
WBC: 4.4 10*3/uL (ref 3.4–10.8)

## 2018-05-14 ENCOUNTER — Ambulatory Visit: Payer: BLUE CROSS/BLUE SHIELD | Admitting: Nurse Practitioner

## 2018-05-14 ENCOUNTER — Encounter: Payer: Self-pay | Admitting: Nurse Practitioner

## 2018-05-14 DIAGNOSIS — L719 Rosacea, unspecified: Secondary | ICD-10-CM | POA: Diagnosis not present

## 2018-05-14 MED ORDER — DOXYCYCLINE HYCLATE 100 MG PO TABS: 100.0000 mg | ORAL_TABLET | Freq: Two times a day (BID) | ORAL | 0 refills | Status: AC

## 2018-05-14 MED ORDER — METRONIDAZOLE 0.75 % EX CREA: TOPICAL_CREAM | Freq: Two times a day (BID) | CUTANEOUS | 0 refills | Status: AC

## 2018-05-14 NOTE — Progress Notes (Signed)
BP 97/64 (BP Location: Left Arm, Patient Position: Sitting, Cuff Size: Normal)   Pulse 70   Temp 98.3 F (36.8 C)   Wt 161 lb 8 oz (73.3 kg)   SpO2 97%   BMI 26.07 kg/m    Subjective:    Patient ID: [name], female    DOB: [birth year], 62 y.o.   MRN: [id number]  HPI: [name] is a 62 y.o. female  Chief Complaint  Patient presents with  . Rash    on the side of nose, x 1 day   RASH: First noticed it yesterday.  Was out for a few hours trimming bushes, but feels it was there prior to this.  No sun tan lotion applied while outside.  Does not feel this is poison ivy, which she was last treated for in 2016.  States this does not hurt or itch.  Reports she has had similar breakouts before when outdoors, but she endorses never wearing sunscreen.  Denies any eye pain or vision changes.  Has not noticed any rashes to skin in any other areas at this time. Duration:  days  Location: face , nose Itching: no Burning: yes Redness: yes Oozing: no Scaling: no Blisters: no Painful: no Fevers: no Change in detergents/soaps/personal care products: no Recent illness: no Recent travel:no History of same: no Context: stable Alleviating factors: nothing Treatments attempted:nothing Shortness of breath: no  Throat/tongue swelling: no Myalgias/arthralgias: no   Relevant past medical, surgical, family and social history reviewed and updated as indicated. Interim medical history since our last visit reviewed. Allergies and medications reviewed and updated.  Review of Systems  Constitutional: Negative for activity change, chills, fatigue and fever.  HENT: Negative for congestion, postnasal drip, rhinorrhea, sinus pain and sore throat.   Eyes: Negative for pain, discharge, itching and visual disturbance.  Respiratory: Negative for cough, chest tightness and shortness of breath.   Cardiovascular: Negative for chest pain and palpitations.  Skin: Positive for rash.       To nose  only, denies rashes to any other area.  Neurological: Negative for dizziness, weakness and headaches.    Per HPI unless specifically indicated above     Objective:    BP 97/64 (BP Location: Left Arm, Patient Position: Sitting, Cuff Size: Normal)   Pulse 70   Temp 98.3 F (36.8 C)   Wt 161 lb 8 oz (73.3 kg)   SpO2 97%   BMI 26.07 kg/m   Wt Readings from Last 3 Encounters:  05/14/18 161 lb 8 oz (73.3 kg)  03/12/18 153 lb (69.4 kg)  11/16/17 152 lb 1.6 oz (69 kg)    Physical Exam  Constitutional: She is oriented to person, place, and time. She appears well-developed and well-nourished.  HENT:  Head: Normocephalic and atraumatic.  Eyes: Pupils are equal, round, and reactive to light. Conjunctivae are normal.  Neck: Normal range of motion.  Cardiovascular: Normal rate, regular rhythm and normal heart sounds.  Pulmonary/Chest: Effort normal and breath sounds normal.  Abdominal: Soft. Bowel sounds are normal.  Neurological: She is alert and oriented to person, place, and time.  Skin: Skin is warm and dry. Rash noted.  Erythema noted to right lateral nare with small pustule in middle, intact.  Area appears slightly edematous.  Small area of erythema to labile fold right nare.  Erythema slightly present to left nare with no edema.  No erythema to cheeks or forehead.  No rashes noted to arms or legs.   Psychiatric: She has  a normal mood and affect. Her behavior is normal. Judgment and thought content normal.    Results for orders placed or performed in visit on 05/14/18  HM MAMMOGRAPHY  Result Value Ref Range   HM Mammogram Self Reported Normal 0-4 Bi-Rad, Self Reported Normal  HM PAP SMEAR  Result Value Ref Range   HM Pap smear normal       Assessment & Plan:   Problem List Items Addressed This Visit      Musculoskeletal and Integument   Rosacea, acne    New onset.  Noted to nose, R>L.  Doxycycline 100MG  PO BID x 7 days take with probiotic twice a day.  Metronidazole 0.75%  cream apply small amount to nose twice a day x 30 days. Encouraged her to wear sunscreen while outside to avoid further breakouts.  Return if worsening symptoms.          Follow up plan: Return if symptoms worsen or fail to improve.

## 2018-05-14 NOTE — Assessment & Plan Note (Addendum)
New onset.  Noted to nose, R>L.  Doxycycline 100MG  PO BID x 7 days take with probiotic twice a day.  Metronidazole 0.75% cream apply small amount to nose twice a day x 30 days. Encouraged her to wear sunscreen while outside to avoid further breakouts.  Return if worsening symptoms.

## 2018-05-14 NOTE — Patient Instructions (Signed)
Avoid contact with eyes with lotion.  Take probiotic tablet twice a day or eat yogurt with antibiotic while taking it and continue this for 7 days after antibiotic Rosacea Rosacea is a long-term (chronic) condition that affects the skin of the face, including the cheeks, nose, brow, and chin. This condition can also affect the eyes. Rosacea causes blood vessels near the surface of the skin to get bigger (be enlarged), and that makes the skin red. There is no cure for this condition, but treatment can help to control your symptoms. Follow these instructions at home: Skin Care Take care of your skin as told by your doctor. Your doctor may tell you do these things:  Wash your skin gently two or more times each day.  Use mild soap.  Use a sunscreen or sunblock with SPF 30 or greater.  Use gentle cosmetics that are meant for sensitive skin.  Shave with an electric shaver instead of a blade.  Lifestyle  Try to keep track of what foods trigger this condition. Avoid any triggers. These may include: ? Spicy foods. ? Seafood. ? Cheese. ? Hot liquids. ? Nuts. ? Chocolate. ? Iodized salt.  Do not drink alcohol.  Avoid extremely cold or hot temperatures.  Try to reduce your stress. If you need help to do this, talk with your doctor.  When you exercise, do these things to stay cool: ? Limit your sun exposure. ? Use a fan. ? Exercise for a shorter time, and exercise more often. General instructions  Keep all follow-up visits as told by your doctor. This is important.  Take over-the-counter and prescription medicines only as told by your doctor.  If your eyelids are affected, press warm compresses to them. Do this as told by your doctor.  If you were prescribed an antibiotic medicine, apply or take it as told by your doctor. Do not stop using the antibiotic even if your condition improves. Contact a doctor if:  Your symptoms get worse.  Your symptoms do not improve after two months  of treatment.  You have new symptoms.  You have any changes in vision or you have problems with your eyes, such as redness or itching.  You feel very sad (depressed).  You lose your appetite.  You have trouble concentrating. This information is not intended to replace advice given to you by your health care provider. Make sure you discuss any questions you have with your health care provider. Document Released: 10/02/2011 Document Revised: 12/16/2015 Document Reviewed: 09/16/2014 Elsevier Interactive Patient Education  [employer].  is finished.

## 2018-05-16 ENCOUNTER — Ambulatory Visit: Payer: BLUE CROSS/BLUE SHIELD | Admitting: Family Medicine

## 2018-05-16 ENCOUNTER — Other Ambulatory Visit: Payer: Self-pay

## 2018-05-16 ENCOUNTER — Encounter: Payer: Self-pay | Admitting: Family Medicine

## 2018-05-16 VITALS — BP 114/73 | HR 58 | Temp 98.3°F | Ht 67.0 in | Wt 160.3 lb

## 2018-05-16 DIAGNOSIS — R21 Rash and other nonspecific skin eruption: Secondary | ICD-10-CM | POA: Diagnosis not present

## 2018-05-16 NOTE — Progress Notes (Signed)
   BP 114/73   Pulse (!) 58   Temp 98.3 F (36.8 C) (Oral)   Ht 5\' 7"  (1.702 m)   Wt 160 lb 4.8 oz (72.7 kg)   SpO2 99%   BMI 25.11 kg/m    Subjective:    Patient ID: [name], female    DOB: 06/22/1956, 62 y.o.   MRN: [id number]  HPI: [name] is a 62 y.o. female  Chief Complaint  Patient presents with  . Rash    f/u   Here today following up on a facial rash for which she was seen two days ago. Presented with several pustules on outside of nares that seemed to pop up after working outside. Dx'd with acne rosacea and given doxycycline x 10 days and metrocream. Has been taking both faithfully without side effects. Minimal improvement noted so far but denies worsening, fevers, pain.   Relevant past medical, surgical, family and social history reviewed and updated as indicated. Interim medical history since our last visit reviewed. Allergies and medications reviewed and updated.  Review of Systems  Per HPI unless specifically indicated above     Objective:    BP 114/73   Pulse (!) 58   Temp 98.3 F (36.8 C) (Oral)   Ht 5\' 7"  (1.702 m)   Wt 160 lb 4.8 oz (72.7 kg)   SpO2 99%   BMI 25.11 kg/m   Wt Readings from Last 3 Encounters:  05/16/18 160 lb 4.8 oz (72.7 kg)  05/14/18 161 lb 8 oz (73.3 kg)  03/12/18 153 lb (69.4 kg)    Physical Exam  Constitutional: She is oriented to person, place, and time. She appears well-developed and well-nourished. No distress.  HENT:  Head: Atraumatic.  Eyes: Conjunctivae and EOM are normal.  Neck: Normal range of motion. Neck supple.  Cardiovascular: Normal rate and regular rhythm.  Pulmonary/Chest: Effort normal and breath sounds normal.  Musculoskeletal: Normal range of motion.  Neurological: She is alert and oriented to person, place, and time.  Skin: Skin is warm and dry.  Several pinpoint pustules noted on right side of nose  Psychiatric: She has a normal mood and affect. Her behavior is normal.  Nursing note and  vitals reviewed.   Results for orders placed or performed in visit on 05/14/18  HM MAMMOGRAPHY  Result Value Ref Range   HM Mammogram Self Reported Normal 0-4 Bi-Rad, Self Reported Normal  HM PAP SMEAR  Result Value Ref Range   HM Pap smear normal       Assessment & Plan:   Problem List Items Addressed This Visit    None    Visit Diagnoses    Rash    -  Primary   Difficult to differentiate between folliculitis or acne at this time. Cont regimen, may also use hibiclens, warm compresses and antibiotic ointment.        Follow up plan: Return for as scheduled.

## 2018-05-16 NOTE — Patient Instructions (Signed)
Hibiclens - chlorhexidine

## 2019-01-20 LAB — HM MAMMOGRAPHY

## 2019-02-04 ENCOUNTER — Telehealth: Payer: Self-pay | Admitting: Family Medicine

## 2019-02-04 NOTE — Telephone Encounter (Signed)
Needs appointment

## 2019-02-04 NOTE — Telephone Encounter (Signed)
Pt would like to have clobetasol topical cream to [facility] [address] for a summer rash. Pt stated this is the same rash she had August of last year. Pt refused appt and would just like cream.

## 2019-02-04 NOTE — Telephone Encounter (Signed)
Appt scheduled with Dr Jeananne Rama.

## 2019-02-05 ENCOUNTER — Ambulatory Visit (INDEPENDENT_AMBULATORY_CARE_PROVIDER_SITE_OTHER): Payer: BC Managed Care – PPO | Admitting: Family Medicine

## 2019-02-05 ENCOUNTER — Encounter: Payer: Self-pay | Admitting: Family Medicine

## 2019-02-05 ENCOUNTER — Other Ambulatory Visit: Payer: Self-pay

## 2019-02-05 DIAGNOSIS — L259 Unspecified contact dermatitis, unspecified cause: Secondary | ICD-10-CM | POA: Insufficient documentation

## 2019-02-05 MED ORDER — CLOBETASOL PROPIONATE 0.05 % EX CREA: 1.0000 "application " | TOPICAL_CREAM | Freq: Two times a day (BID) | CUTANEOUS | 4 refills | Status: DC

## 2019-02-05 MED ORDER — DESONIDE 0.05 % EX CREA: TOPICAL_CREAM | Freq: Two times a day (BID) | CUTANEOUS | 4 refills | Status: DC

## 2019-02-05 NOTE — Progress Notes (Addendum)
   There were no vitals taken for this visit.   Subjective:    Patient ID: [name], female    DOB: 1955/09/24, 63 y.o.   MRN: [id number]  HPI: [name] is a 63 y.o. female  Med check  Discussed with patient doing well with medications. Has 2 new problems that come on every summer.  Some foot rash which gets inflamed and irritated is responded well to Temovate cream. Also has rash on her legs that responds well to desonide cream. Other issues are doing well with rosacea and back.   Relevant past medical, surgical, family and social history reviewed and updated as indicated. Interim medical history since our last visit reviewed. Allergies and medications reviewed and updated.  Review of Systems  Constitutional: Negative.   Respiratory: Negative.   Cardiovascular: Negative.     Per HPI unless specifically indicated above     Objective:    There were no vitals taken for this visit.  Wt Readings from Last 3 Encounters:  05/16/18 160 lb 4.8 oz (72.7 kg)  05/14/18 161 lb 8 oz (73.3 kg)  03/12/18 153 lb (69.4 kg)    Physical Exam  Results for orders placed or performed in visit on 05/14/18  HM MAMMOGRAPHY  Result Value Ref Range   HM Mammogram Self Reported Normal 0-4 Bi-Rad, Self Reported Normal  HM PAP SMEAR  Result Value Ref Range   HM Pap smear normal       Assessment & Plan:   Problem List Items Addressed This Visit    None    Contact dermatitis Discussed care and treatment with Temovate cream Heat rash on her legs discussed care and treatment using DesOwen cream. Discussed COVID-19 concerns and treatment. desonide  Telemedicine using audio/video telecommunications for a synchronous communication visit. Today's visit due to COVID-19 isolation precautions I connected with and verified that I am speaking with the correct person using two identifiers.   I discussed the limitations, risks, security and privacy concerns of performing an evaluation and  management service by telecommunication and the availability of in person appointments. I also discussed with the patient that there may be a patient responsible charge related to this service. The patient expressed understanding and agreed to proceed. The patient's location is I am at home.   I discussed the assessment and treatment plan with the patient. The patient was provided an opportunity to ask questions and all were answered. The patient agreed with the plan and demonstrated an understanding of the instructions.   The patient was advised to call back or seek an in-person evaluation if the symptoms worsen or if the condition fails to improve as anticipated.   I provided 21+ minutes of time during this encounter. Follow up plan: Return if symptoms worsen or fail to improve, for Physical Exam.

## 2019-02-06 ENCOUNTER — Telehealth: Payer: Self-pay | Admitting: Family Medicine

## 2019-02-06 NOTE — Telephone Encounter (Signed)
Medication Refill - Medication: clindamycin 1 percent lotion   Has the patient contacted their pharmacy? Yes.   (Agent: If no, request that the patient contact the pharmacy for the refill.) (Agent: If yes, when and what did the pharmacy advise?)  Preferred Pharmacy (with phone number or street name):  [place], [place] [phone] (Phone) [phone] (Fax)     Agent: Please be advised that RX refills may take up to 3 business days. We ask that you follow-up with your pharmacy.

## 2019-02-07 NOTE — Telephone Encounter (Signed)
Pt calling to check status. Pt states that she is starting to break out because of her mask. Pt states that she has used this medication before but filled by a different provider. Please advise

## 2019-02-08 MED ORDER — MOMETASONE FUROATE 0.1 % EX CREA: 1.0000 "application " | TOPICAL_CREAM | Freq: Every day | CUTANEOUS | 0 refills | Status: DC

## 2019-02-27 ENCOUNTER — Telehealth: Payer: Self-pay | Admitting: Family Medicine

## 2019-02-27 MED ORDER — EPINEPHRINE 0.3 MG/0.3ML IJ SOAJ: 0.3000 mg | INTRAMUSCULAR | 0 refills | Status: AC | PRN

## 2019-02-27 NOTE — Telephone Encounter (Signed)
Pt is calling and needs new epipen the old epipen has expired . Pt would like a twin pak. [facility] [address] [facility] [place]

## 2019-02-27 NOTE — Telephone Encounter (Signed)
Rx sent 

## 2019-05-28 ENCOUNTER — Ambulatory Visit (INDEPENDENT_AMBULATORY_CARE_PROVIDER_SITE_OTHER): Payer: BC Managed Care – PPO | Admitting: Family Medicine

## 2019-05-28 ENCOUNTER — Encounter: Payer: Self-pay | Admitting: Family Medicine

## 2019-05-28 ENCOUNTER — Other Ambulatory Visit: Payer: Self-pay

## 2019-05-28 VITALS — Temp 98.2°F

## 2019-05-28 DIAGNOSIS — R300 Dysuria: Secondary | ICD-10-CM

## 2019-05-28 MED ORDER — SULFAMETHOXAZOLE-TRIMETHOPRIM 800-160 MG PO TABS: 1.0000 | ORAL_TABLET | Freq: Two times a day (BID) | ORAL | 0 refills | Status: DC

## 2019-05-28 MED ORDER — PHENAZOPYRIDINE HCL 200 MG PO TABS: 200.0000 mg | ORAL_TABLET | Freq: Three times a day (TID) | ORAL | 0 refills | Status: DC | PRN

## 2019-05-28 NOTE — Progress Notes (Signed)
Temp 98.2 F (36.8 C) (Temporal)    Subjective:    Patient ID: [name], female    DOB: February 27, 1956, 63 y.o.   MRN: [id number]  HPI: Tarry Blayney is a 63 y.o. female  Chief Complaint  Patient presents with  . Urinary Tract Infection    pt states she is having urinary frequency, burning, pressure, and pain since this morning    . This visit was completed via WebEx due to the restrictions of the COVID-19 pandemic. All issues as above were discussed and addressed. Physical exam was done as above through visual confirmation on WebEx. If it was felt that the patient should be evaluated in the office, they were directed there. The patient verbally consented to this visit. . Location of the patient: home . Location of the provider: work . Those involved with this call:  . Provider: Merrie Roof, PA-C . CMA: Lesle Chris, [place] . Front Desk/Registration: Jill Side  . Time spent on call: 15 minutes with patient face to face via video conference. More than 50% of this time was spent in counseling and coordination of care. 5 minutes total spent in review of patient's record and preparation of their chart. I verified patient identity using two factors (patient name and date of birth). Patient consents verbally to being seen via telemedicine visit today.   Urinary frequency, dysuria, cloudiness, pink tinge to urine, mild low back cramping. Taking ibuprofen with some mild relief. No fever, chills, abdominal pain, N/V/D.   Relevant past medical, surgical, family and social history reviewed and updated as indicated. Interim medical history since our last visit reviewed. Allergies and medications reviewed and updated.  Review of Systems  Per HPI unless specifically indicated above     Objective:    Temp 98.2 F (36.8 C) (Temporal)   Wt Readings from Last 3 Encounters:  05/16/18 160 lb 4.8 oz (72.7 kg)  05/14/18 161 lb 8 oz (73.3 kg)  03/12/18 153 lb (69.4 kg)    Physical Exam  Vitals signs and nursing note reviewed.  Constitutional:      General: She is not in acute distress.    Appearance: Normal appearance.  HENT:     Head: Atraumatic.     Right Ear: External ear normal.     Left Ear: External ear normal.     Nose: Nose normal. No congestion.     Mouth/Throat:     Mouth: Mucous membranes are moist.     Pharynx: Oropharynx is clear. No posterior oropharyngeal erythema.  Eyes:     Extraocular Movements: Extraocular movements intact.     Conjunctiva/sclera: Conjunctivae normal.  Neck:     Musculoskeletal: Normal range of motion.  Cardiovascular:     Comments: Unable to assess via virtual visit Pulmonary:     Effort: Pulmonary effort is normal. No respiratory distress.  Abdominal:     Comments: Unable to examine due to virtual nature of visit  Musculoskeletal: Normal range of motion.  Skin:    General: Skin is dry.     Findings: No erythema.  Neurological:     Mental Status: She is alert and oriented to person, place, and time.  Psychiatric:        Mood and Affect: Mood normal.        Thought Content: Thought content normal.        Judgment: Judgment normal.     Results for orders placed or performed in visit on 05/14/18  HM MAMMOGRAPHY  Result Value  Ref Range   HM Mammogram Self Reported Normal 0-4 Bi-Rad, Self Reported Normal  HM PAP SMEAR  Result Value Ref Range   HM Pap smear normal       Assessment & Plan:   Problem List Items Addressed This Visit    None    Visit Diagnoses    Dysuria    -  Primary   Sxs consistent with UTI. Pt not comfortable presenting for U/A, will tx empirically with bactrim and pyridium prn. Push fluids, f/u if not improving        Follow up plan: Return if symptoms worsen or fail to improve.

## 2020-02-06 ENCOUNTER — Other Ambulatory Visit: Payer: Self-pay | Admitting: Family Medicine

## 2020-02-06 ENCOUNTER — Telehealth: Payer: Self-pay | Admitting: Family Medicine

## 2020-02-06 NOTE — Telephone Encounter (Signed)
Routing to provider  

## 2020-02-06 NOTE — Telephone Encounter (Signed)
Pt called and is requesting to have PCP look over these medications. She states that PCP would give them to her if she would look at them. PT declined appt and is requesting to have Roosvelt Maser give her a call back. Please advise.

## 2020-02-06 NOTE — Telephone Encounter (Signed)
Requested medication (s) are due for refill today: no  Requested medication (s) are on the active medication list: yes  Last refill:  03/11/2019  Future visit scheduled: no  Notes to clinic:  patient is over due for 12 month follow up   Requested Prescriptions  Pending Prescriptions Disp Refills   desonide (DESOWEN) 0.05 % cream [Pharmacy Med Name: DESONIDE 0.05% CREAM] 30 g 0    Sig: Apply topically 2 (two) times daily.      Dermatology:  Corticosteroids Failed - 02/06/2020 11:02 AM      Failed - Valid encounter within last 12 months    Recent Outpatient Visits           8 months ago Dysuria   [facility] Particia [place], [place]   1 year ago Contact dermatitis, unspecified contact dermatitis type, unspecified trigger   [facility] Steele Sizer, MD   1 year ago Rash   [facility] Roosvelt Maser [place], [place]   1 year ago Rosacea, acne   [facility] [place], Corrie Dandy T, NP   1 year ago Vasculitis of skin   [facility] Roosvelt Maser [place], [place]

## 2020-02-06 NOTE — Telephone Encounter (Signed)
Requested medication (s) are due for refill today -yes  Requested medication (s) are on the active medication list -yes  Future visit scheduled -no  Last refill: 5/51/21  Notes to clinic: Attempted to call patient to schedule CPE- left message to call office to schedule. Rx request sent for review  Requested Prescriptions  Pending Prescriptions Disp Refills   clobetasol cream (TEMOVATE) 0.05 % [Pharmacy Med Name: CLOBETASOL 0.05% CREAM] 30 g 0    Sig: Apply 1 application topically 2 (two) times daily.      Dermatology:  Corticosteroids Failed - 02/06/2020 11:05 AM      Failed - Valid encounter within last 12 months    Recent Outpatient Visits           8 months ago Dysuria   [facility] Particia [place], [place]   1 year ago Contact dermatitis, unspecified contact dermatitis type, unspecified trigger   [facility] [place], Redge Gainer, MD   1 year ago Rash   [facility] Roosvelt Maser [place], [place]   1 year ago Rosacea, acne   [facility] [place], Corrie Dandy T, NP   1 year ago Vasculitis of skin   [facility] Roosvelt Maser [place], [place]                  Requested Prescriptions  Pending Prescriptions Disp Refills   clobetasol cream (TEMOVATE) 0.05 % [Pharmacy Med Name: CLOBETASOL 0.05% CREAM] 30 g 0    Sig: Apply 1 application topically 2 (two) times daily.      Dermatology:  Corticosteroids Failed - 02/06/2020 11:05 AM      Failed - Valid encounter within last 12 months    Recent Outpatient Visits           8 months ago Dysuria   [facility] Particia [place], [place]   1 year ago Contact dermatitis, unspecified contact dermatitis type, unspecified trigger   [facility] Steele Sizer, MD   1 year ago Rash   [facility] Roosvelt Maser [place], [place]   1 year ago Rosacea, acne   [facility] [place], Corrie Dandy T, NP   1 year ago Vasculitis of skin    [facility] Roosvelt Maser [place], [place]

## 2020-02-09 ENCOUNTER — Ambulatory Visit: Payer: BC Managed Care – PPO | Admitting: Podiatry

## 2020-02-09 ENCOUNTER — Other Ambulatory Visit: Payer: Self-pay

## 2020-02-09 ENCOUNTER — Encounter: Payer: Self-pay | Admitting: Family Medicine

## 2020-02-09 ENCOUNTER — Ambulatory Visit: Payer: BC Managed Care – PPO | Admitting: Family Medicine

## 2020-02-09 ENCOUNTER — Encounter: Payer: Self-pay | Admitting: Podiatry

## 2020-02-09 ENCOUNTER — Ambulatory Visit (INDEPENDENT_AMBULATORY_CARE_PROVIDER_SITE_OTHER): Payer: BC Managed Care – PPO

## 2020-02-09 VITALS — BP 117/78 | HR 62 | Temp 97.9°F | Wt 159.0 lb

## 2020-02-09 DIAGNOSIS — R21 Rash and other nonspecific skin eruption: Secondary | ICD-10-CM

## 2020-02-09 DIAGNOSIS — M6740 Ganglion, unspecified site: Secondary | ICD-10-CM

## 2020-02-09 DIAGNOSIS — M19079 Primary osteoarthritis, unspecified ankle and foot: Secondary | ICD-10-CM

## 2020-02-09 MED ORDER — CLOBETASOL PROPIONATE 0.05 % EX CREA: 1.0000 "application " | TOPICAL_CREAM | Freq: Two times a day (BID) | CUTANEOUS | 1 refills | Status: DC | PRN

## 2020-02-09 MED ORDER — PREDNISONE 10 MG PO TABS: ORAL_TABLET | ORAL | 0 refills | Status: DC

## 2020-02-09 MED ORDER — DESONIDE 0.05 % EX CREA: TOPICAL_CREAM | Freq: Two times a day (BID) | CUTANEOUS | 1 refills | Status: DC | PRN

## 2020-02-09 NOTE — Progress Notes (Signed)
BP 117/78   Pulse 62   Temp 97.9 F (36.6 C) (Oral)   Wt 159 lb (72.1 kg)   SpO2 98%   BMI 24.90 kg/m    Subjective:    Patient ID: [name], female    DOB: 09/21/1955, 64 y.o.   MRN: [id number]  HPI: [name] is a 64 y.o. female  Chief Complaint  Patient presents with  . Rash    all over since last Friday   Here today for poison ivy rash that's been present for about 5 days now. Started on hands, mainly left hand but now some areas are coming up on the face. Put clobetasol cream on hands which has helped tremendously and the past day or so has been putting hydrocortisone cream on the face. Notes the facial rash still spreading and itching which concerned her. Also needing refills on her steroid creams. Denies insect bites, new meds or hygiene products, new foods.   Relevant past medical, surgical, family and social history reviewed and updated as indicated. Interim medical history since our last visit reviewed. Allergies and medications reviewed and updated.  Review of Systems  Per HPI unless specifically indicated above     Objective:    BP 117/78   Pulse 62   Temp 97.9 F (36.6 C) (Oral)   Wt 159 lb (72.1 kg)   SpO2 98%   BMI 24.90 kg/m   Wt Readings from Last 3 Encounters:  02/09/20 159 lb (72.1 kg)  05/16/18 160 lb 4.8 oz (72.7 kg)  05/14/18 161 lb 8 oz (73.3 kg)    Physical Exam Vitals and nursing note reviewed.  Constitutional:      Appearance: Normal appearance. She is not ill-appearing.  HENT:     Head: Atraumatic.  Eyes:     Extraocular Movements: Extraocular movements intact.     Conjunctiva/sclera: Conjunctivae normal.  Cardiovascular:     Rate and Rhythm: Normal rate and regular rhythm.     Heart sounds: Normal heart sounds.  Pulmonary:     Effort: Pulmonary effort is normal.     Breath sounds: Normal breath sounds.  Musculoskeletal:        General: Normal range of motion.     Cervical back: Normal range of motion and neck supple.   Skin:    General: Skin is warm and dry.     Findings: Rash (erythematous maculopapular rash wtih scattered blisters on left hand and face noted ) present.  Neurological:     Mental Status: She is alert and oriented to person, place, and time.  Psychiatric:        Mood and Affect: Mood normal.        Thought Content: Thought content normal.        Judgment: Judgment normal.    Results for orders placed or performed in visit on 05/14/18  HM MAMMOGRAPHY  Result Value Ref Range   HM Mammogram Self Reported Normal 0-4 Bi-Rad, Self Reported Normal  HM PAP SMEAR  Result Value Ref Range   HM Pap smear normal       Assessment & Plan:   Problem List Items Addressed This Visit    None    Visit Diagnoses    Rash    -  Primary   Requesting prednisone taper over mild steroid cream therapy given spread, prn hydrocortisone cream additionally. F/u if not resolving       Follow up plan: Return if symptoms worsen or fail to improve.

## 2020-02-09 NOTE — Telephone Encounter (Signed)
Will discuss during OV today

## 2020-02-09 NOTE — Progress Notes (Signed)
  Subjective:  Patient ID: [name], female    DOB: [birth year],  MRN: [id number] HPI Chief Complaint  Patient presents with  . Toe Pain    2nd toe right - knot x 1 year, did have an injury toe the toe previously, thinks it could be a mucoid cyst  . New Patient (Initial Visit)    64 y.o. female presents with the above complaint.   ROS: Denies fever chills nausea vomiting muscle aches pains calf pain back pain chest pain shortness of breath.  Past Medical History:  Diagnosis Date  . Osteoporosis    Past Surgical History:  Procedure Laterality Date  . EXPLORATORY LAPAROTOMY      Current Outpatient Medications:  Marland Kitchen  Multiple Vitamin (MULTIVITAMIN) capsule, Take 1 capsule by mouth daily., Disp: , Rfl:  .  aspirin EC 81 MG tablet, Take by mouth., Disp: , Rfl:  .  EPINEPHrine (EPIPEN 2-PAK) 0.3 mg/0.3 mL IJ SOAJ injection, Inject 0.3 mLs (0.3 mg total) into the muscle as needed for anaphylaxis., Disp: 1 each, Rfl: 0  Allergies  Allergen Reactions  . Iohexol      Code: RASH, Desc: Pt states she sometimes breaks out in a rash from IV contrast.  She needs full premeds.    Review of Systems Objective:  There were no vitals filed for this visit.  General: Well developed, nourished, in no acute distress, alert and oriented x3   Dermatological: Skin is warm, dry and supple bilateral. Nails x 10 are well maintained; remaining integument appears unremarkable at this time. There are no open sores, no preulcerative lesions, no rash or signs of infection present.  Vascular: Dorsalis Pedis artery and Posterior Tibial artery pedal pulses are 2/4 bilateral with immedate capillary fill time. Pedal hair growth present. No varicosities and no lower extremity edema present bilateral.   Neruologic: Grossly intact via light touch bilateral. Vibratory intact via tuning fork bilateral. Protective threshold with Semmes Wienstein monofilament intact to all pedal sites bilateral. Patellar and Achilles  deep tendon reflexes 2+ bilateral. No Babinski or clonus noted bilateral.   Musculoskeletal: No gross boney pedal deformities bilateral. No pain, crepitus, or limitation noted with foot and ankle range of motion bilateral. Muscular strength 5/5 in all groups tested bilateral.  Palpable spur at the level of the dorsal lateral DIPJ right.  She has a very small cyst associated with this.  Gait: Unassisted, Nonantalgic.    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  No other significant findings.  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  Plan: At this point after considerable discussion we agreed on numbing the toe up and trying to aspirate the lesion.  We did so and after sterile Betadine skin prep we were unable to achieve a lot of mucoid product however we were able to palpate the exostosis from the arthritis much better.  We placed in a dry sterile compressive dressing.  I will follow-up with her on an as-needed basis we did discuss the need for surgical intervention she declined.     Yunique Dearcos T. [place], [place]

## 2020-04-05 ENCOUNTER — Telehealth: Payer: Self-pay

## 2020-04-05 NOTE — Telephone Encounter (Signed)
Patient called this morning at 6:56 am requesting an appt for today. Patient reports yellowjacket sting on her finger, reports swelling and is concerned about nail bed.

## 2020-04-05 NOTE — Telephone Encounter (Signed)
Called pt to schedule, no answer, left vm. Next appt is thursday

## 2020-04-05 NOTE — Telephone Encounter (Signed)
Pt is calling and she was able to have another provider call her in something for yellow jacket sting. Pt would like a callback from  the office tomorrow concerning feedback of not be able to see provider until thursday

## 2020-04-06 NOTE — Telephone Encounter (Signed)
Called and LVM asking for patient to please call back if she would like to discuss something with [place].

## 2020-05-19 ENCOUNTER — Other Ambulatory Visit: Payer: Self-pay

## 2020-05-19 ENCOUNTER — Ambulatory Visit: Payer: BC Managed Care – PPO | Admitting: Dermatology

## 2020-05-19 DIAGNOSIS — L299 Pruritus, unspecified: Secondary | ICD-10-CM

## 2020-05-19 DIAGNOSIS — L989 Disorder of the skin and subcutaneous tissue, unspecified: Secondary | ICD-10-CM

## 2020-05-19 DIAGNOSIS — L821 Other seborrheic keratosis: Secondary | ICD-10-CM

## 2020-05-19 DIAGNOSIS — L820 Inflamed seborrheic keratosis: Secondary | ICD-10-CM

## 2020-05-19 DIAGNOSIS — L853 Xerosis cutis: Secondary | ICD-10-CM | POA: Diagnosis not present

## 2020-05-19 DIAGNOSIS — B079 Viral wart, unspecified: Secondary | ICD-10-CM

## 2020-05-19 NOTE — Progress Notes (Signed)
   New Patient Visit  Subjective  [name] is a 64 y.o. female who presents for the following: lesion (on the L neck - rubs on her necklace, irritated, raised), lesion (possible wart on the R thumb - patient picks at it), lesion (on the mid back - itches, irritated ), and lesion (on the lip - patient feels a bump, and thinks it may be a pimple, but would like it checked).  The following portions of the chart were reviewed this encounter and updated as appropriate:  Tobacco  Allergies  Meds  Problems  Med Hx  Surg Hx  Fam Hx     Review of Systems:  No other skin or systemic complaints except as noted in HPI or Assessment and Plan.  Objective  Well appearing patient in no apparent distress; mood and affect are within normal limits.  A focused examination was performed including the face, trunk, extremities. Relevant physical exam findings are noted in the Assessment and Plan.  Objective  L neck x 1, mid back spinal x 1 (2): Erythematous keratotic or waxy stuck-on papule or plaque.   Objective  R thumb: Verrucous papules -- Discussed viral etiology and contagion.   Objective  upper lip: Normal skin   Objective  Trunk, extremities: Dry skin   Assessment & Plan  Inflamed seborrheic keratosis (2) L neck x 1, mid back spinal x 1  Destruction of lesion - L neck x 1, mid back spinal x 1 Complexity: simple   Destruction method: cryotherapy   Informed consent: discussed and consent obtained   Timeout:  patient name, date of birth, surgical site, and procedure verified Lesion destroyed using liquid nitrogen: Yes   Region frozen until ice ball extended beyond lesion: Yes   Outcome: patient tolerated procedure well with no complications   Post-procedure details: wound care instructions given    Viral warts, unspecified type R thumb  Destruction of lesion - R thumb Complexity: simple   Destruction method: cryotherapy   Informed consent: discussed and consent obtained     Timeout:  patient name, date of birth, surgical site, and procedure verified Lesion destroyed using liquid nitrogen: Yes   Region frozen until ice ball extended beyond lesion: Yes   Outcome: patient tolerated procedure well with no complications   Post-procedure details: wound care instructions given    Benign skin lesion upper lip  Normal skin tissue - benign appearing   Xerosis cutis Trunk, extremities With pruritus on the back -  Recommend CeraVe cream daily.  Mild cleansers   Seborrheic Keratoses - Stuck-on, waxy, tan-brown papules and plaques  - Discussed benign etiology and prognosis. - Observe - Call for any changes  Return in about 3 months (around 08/19/2020) for ISK recheck .  Maylene Roes, CMA, am acting as scribe for Armida Sans, MD .  Documentation: I have reviewed the above documentation for accuracy and completeness, and I agree with the above.  Armida Sans, MD

## 2020-05-20 ENCOUNTER — Encounter: Payer: Self-pay | Admitting: Dermatology

## 2020-05-24 ENCOUNTER — Encounter: Payer: Self-pay | Admitting: Dermatology

## 2020-08-16 ENCOUNTER — Ambulatory Visit: Payer: Self-pay | Admitting: Dermatology

## 2020-08-25 ENCOUNTER — Ambulatory Visit: Payer: BC Managed Care – PPO | Admitting: Dermatology

## 2020-09-20 ENCOUNTER — Ambulatory Visit: Payer: BC Managed Care – PPO | Admitting: Dermatology

## 2020-09-20 ENCOUNTER — Other Ambulatory Visit: Payer: Self-pay

## 2020-09-20 DIAGNOSIS — L821 Other seborrheic keratosis: Secondary | ICD-10-CM | POA: Diagnosis not present

## 2020-09-20 DIAGNOSIS — R21 Rash and other nonspecific skin eruption: Secondary | ICD-10-CM | POA: Diagnosis not present

## 2020-09-20 DIAGNOSIS — B079 Viral wart, unspecified: Secondary | ICD-10-CM | POA: Diagnosis not present

## 2020-09-20 NOTE — Patient Instructions (Signed)
Instructions for [facility] Medications  One or more of your medications was sent to the [facility] mail order compounding pharmacy. You will receive an email from them and can purchase the medicine through that link. It will then be mailed to your home at the address you confirmed. If for any reason you do not receive an email from them, please check your spam folder. If you still do not find the email, please let us know. [facility] phone number is [phone].   

## 2020-09-20 NOTE — Progress Notes (Signed)
   Follow-Up Visit   Subjective  [name] is a 65 y.o. female who presents for the following: 3 month isk (Patient here today for follow up on areas on back and neck. Patient states she has some itchy areas under bra. Patient states she has an area on right thumb but is concerned about having froze off. ).  The following portions of the chart were reviewed this encounter and updated as appropriate:  Tobacco  Allergies  Meds  Problems  Med Hx  Surg Hx  Fam Hx      Objective  Well appearing patient in no apparent distress; mood and affect are within normal limits.  A focused examination was performed including back, face, right hand, neck. Relevant physical exam findings are noted in the Assessment and Plan.  Objective  Right Thumb Tip: 3 mm crusted papule probable wart   Objective  Mid Back bra line: mid back bra line clear there was no rash, just itch    Assessment & Plan  Viral warts, unspecified type Right Thumb  Patient declined treatment today. Pt decided to try duct tape to affected areas    Rash - Pruritus - Notalgia Paresthetica Mid Back bra line Rash - Pruritus / Notalgia Paresthetica  Recommend anti-itching compound cream sent to [facility]  Amitriptyline: 5% Gabapentin: 10% Lidocaine: 5% Vehicle: Cream  Seborrheic Keratoses - treated ISKs last visit and resolved - Stuck-on, waxy, tan-brown papules and plaques  - Discussed benign etiology and prognosis. - Observe - Call for any changes  Return for as needed.  IAsher Muir, CMA, am acting as scribe for Armida Sans, MD.  Documentation: I have reviewed the above documentation for accuracy and completeness, and I agree with the above.  Armida Sans, MD

## 2020-09-21 ENCOUNTER — Encounter: Payer: Self-pay | Admitting: Dermatology

## 2020-10-12 ENCOUNTER — Ambulatory Visit: Payer: BC Managed Care – PPO | Admitting: Family Medicine

## 2020-10-12 ENCOUNTER — Encounter: Payer: Self-pay | Admitting: Family Medicine

## 2020-10-12 ENCOUNTER — Telehealth: Payer: BC Managed Care – PPO | Admitting: Internal Medicine

## 2020-10-12 ENCOUNTER — Other Ambulatory Visit: Payer: Self-pay

## 2020-10-12 ENCOUNTER — Telehealth: Payer: Self-pay

## 2020-10-12 VITALS — BP 118/75 | HR 60 | Temp 98.0°F | Ht 67.0 in | Wt 159.0 lb

## 2020-10-12 DIAGNOSIS — R300 Dysuria: Secondary | ICD-10-CM

## 2020-10-12 MED ORDER — SULFAMETHOXAZOLE-TRIMETHOPRIM 800-160 MG PO TABS: 1.0000 | ORAL_TABLET | Freq: Two times a day (BID) | ORAL | 0 refills | Status: DC

## 2020-10-12 NOTE — Telephone Encounter (Signed)
Called pt because she was scheduled for a virtual today at 2. Advised to pt that we would need a sample prior to visit. 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 Pt states that she has taken azo, systex, and cystex so it would mess up the results anyway saying she was thinking that it would clear things but it hasn't. Pt is not wanting to come in to the office as a fear of covid since she lost her dad due to covid. Pt is wanting to do a visit to have bactrim sent in. Please advise.  Copied from CRM [id number]. Topic: General - Other >> Oct 12, 2020  1:13 PM Tamela Oddi wrote: Reason for CRM: Patient would like a call from the nurse to see if she can be worked in today for a UTI.  She does not want to come into the office because she is afraid of covid.  Patient has [employer] ins.  Please advise and call patient asap to schedule if possible for a virtual appt.  CB# [phone]

## 2020-10-12 NOTE — Progress Notes (Signed)
BP 118/75   Pulse 60   Temp 98 F (36.7 C)   Ht 5\' 7"  (1.702 m)   Wt 159 lb (72.1 kg)   SpO2 100%   BMI 24.90 kg/m    Subjective:    Patient ID: , female    DOB: 26-Mar-1956, 65 y.o.   MRN: [id number]  HPI: [name] is a 65 y.o. female  Chief Complaint  Patient presents with  . Dysuria    Patient states she has been feeling burning with urination for 2-3 days    URINARY SYMPTOMS Duration: 2-3 days Dysuria: yes Urinary frequency: yes Urgency: yes Small volume voids: yes Symptom severity: moderate Urinary incontinence: no Foul odor: no Hematuria: no Abdominal pain: yes Back pain: no Suprapubic pain/pressure: no Flank pain: no Fever:  no Vomiting: no Relief with cranberry juice: no Relief with pyridium: no Status: worse Previous urinary tract infection: yes Recurrent urinary tract infection: no History of sexually transmitted disease: no Vaginal discharge: no Treatments attempted: pyridium   Relevant past medical, surgical, family and social history reviewed and updated as indicated. Interim medical history since our last visit reviewed. Allergies and medications reviewed and updated.  Review of Systems  Constitutional: Negative.   Respiratory: Negative.   Cardiovascular: Negative.   Gastrointestinal: Negative.   Genitourinary: Positive for dysuria, frequency and urgency. Negative for decreased urine volume, difficulty urinating, dyspareunia, enuresis, flank pain, genital sores, hematuria, menstrual problem, pelvic pain, vaginal bleeding, vaginal discharge and vaginal pain.  Musculoskeletal: Negative.   Psychiatric/Behavioral: Negative.     Per HPI unless specifically indicated above     Objective:    BP 118/75   Pulse 60   Temp 98 F (36.7 C)   Ht 5\' 7"  (1.702 m)   Wt 159 lb (72.1 kg)   SpO2 100%   BMI 24.90 kg/m   Wt Readings from Last 3 Encounters:  10/12/20 159 lb (72.1 kg)  02/09/20 159 lb (72.1 kg)  05/16/18 160 lb 4.8  oz (72.7 kg)    Physical Exam Vitals and nursing note reviewed.  Constitutional:      General: She is not in acute distress.    Appearance: Normal appearance. She is not ill-appearing, toxic-appearing or diaphoretic.  HENT:     Head: Normocephalic and atraumatic.     Right Ear: External ear normal.     Left Ear: External ear normal.     Nose: Nose normal.     Mouth/Throat:     Mouth: Mucous membranes are moist.     Pharynx: Oropharynx is clear.  Eyes:     General: No scleral icterus.       Right eye: No discharge.        Left eye: No discharge.     Extraocular Movements: Extraocular movements intact.     Conjunctiva/sclera: Conjunctivae normal.     Pupils: Pupils are equal, round, and reactive to light.  Cardiovascular:     Rate and Rhythm: Normal rate and regular rhythm.     Pulses: Normal pulses.     Heart sounds: Normal heart sounds. No murmur heard. No friction rub. No gallop.   Pulmonary:     Effort: Pulmonary effort is normal. No respiratory distress.     Breath sounds: Normal breath sounds. No stridor. No wheezing, rhonchi or rales.  Chest:     Chest wall: No tenderness.  Abdominal:     Tenderness: There is no right CVA tenderness or left CVA tenderness.  Musculoskeletal:  General: Normal range of motion.     Cervical back: Normal range of motion and neck supple.  Skin:    General: Skin is warm and dry.     Capillary Refill: Capillary refill takes less than 2 seconds.     Coloration: Skin is not jaundiced or pale.     Findings: No bruising, erythema, lesion or rash.  Neurological:     General: No focal deficit present.     Mental Status: She is alert and oriented to person, place, and time. Mental status is at baseline.  Psychiatric:        Mood and Affect: Mood normal.        Behavior: Behavior normal.        Thought Content: Thought content normal.        Judgment: Judgment normal.     Results for orders placed or performed in visit on 05/14/18  HM  MAMMOGRAPHY  Result Value Ref Range   HM Mammogram Self Reported Normal 0-4 Bi-Rad, Self Reported Normal  HM PAP SMEAR  Result Value Ref Range   HM Pap smear normal       Assessment & Plan:   Problem List Items Addressed This Visit   None   Visit Diagnoses    Dysuria    -  Primary   Will treat with bactrim- 2+ blood, if not improving consider kidney stone work up   Relevant Orders   Urinalysis, Routine w reflex microscopic   Urine Culture       Follow up plan: Return if symptoms worsen or fail to improve.

## 2020-10-12 NOTE — Telephone Encounter (Signed)
Needs appt can discuss- will need sample for culture

## 2020-10-13 LAB — URINALYSIS, ROUTINE W REFLEX MICROSCOPIC
Bilirubin, UA: NEGATIVE
Glucose, UA: NEGATIVE
Ketones, UA: NEGATIVE
Leukocytes,UA: NEGATIVE
Protein,UA: NEGATIVE
Specific Gravity, UA: 1.025 (ref 1.005–1.030)
Urobilinogen, Ur: 0.2 mg/dL (ref 0.2–1.0)
pH, UA: 5.5 (ref 5.0–7.5)

## 2020-10-13 LAB — MICROSCOPIC EXAMINATION
Bacteria, UA: NONE SEEN
Epithelial Cells (non renal): NONE SEEN /hpf (ref 0–10)
WBC, UA: NONE SEEN /hpf (ref 0–5)

## 2020-10-14 LAB — URINE CULTURE

## 2020-10-18 ENCOUNTER — Telehealth: Payer: Self-pay

## 2020-10-18 NOTE — Telephone Encounter (Signed)
Can we find out if she needs anything? I'm glad she's feeling great- but I don't know how to advise that.

## 2020-10-18 NOTE — Telephone Encounter (Signed)
It was just an FYI, she does not need anything

## 2020-10-18 NOTE — Telephone Encounter (Signed)
Copied from CRM [id number]. Topic: General - Other >> Oct 18, 2020  8:41 AM Wyonia Hough E wrote: Reason for CRM: Pt called to let Dr. Laural Benes know she is feeling great/ she stated she thinks a kidney stone may be coming but other than that she feels fine/ please advise   Routing as a FYI . S.Evanie Buckle

## 2020-10-18 NOTE — Telephone Encounter (Signed)
Can we find out if she needs anything? I'm glad she's feeling great- but I don't know how to advise that. 

## 2021-08-02 ENCOUNTER — Encounter: Payer: Self-pay | Admitting: Nurse Practitioner

## 2021-08-02 ENCOUNTER — Ambulatory Visit: Payer: BC Managed Care – PPO | Admitting: Nurse Practitioner

## 2021-08-02 ENCOUNTER — Other Ambulatory Visit: Payer: Self-pay

## 2021-08-02 VITALS — BP 113/73 | HR 68 | Temp 98.5°F | Ht 67.0 in | Wt 164.8 lb

## 2021-08-02 DIAGNOSIS — R300 Dysuria: Secondary | ICD-10-CM | POA: Diagnosis not present

## 2021-08-02 DIAGNOSIS — N3001 Acute cystitis with hematuria: Secondary | ICD-10-CM | POA: Diagnosis not present

## 2021-08-02 LAB — URINALYSIS, ROUTINE W REFLEX MICROSCOPIC
Bilirubin, UA: NEGATIVE
Glucose, UA: NEGATIVE
Ketones, UA: NEGATIVE
Nitrite, UA: NEGATIVE
Protein,UA: NEGATIVE
Specific Gravity, UA: 1.015 (ref 1.005–1.030)
Urobilinogen, Ur: 0.2 mg/dL (ref 0.2–1.0)
pH, UA: 7.5 (ref 5.0–7.5)

## 2021-08-02 LAB — MICROSCOPIC EXAMINATION: Epithelial Cells (non renal): NONE SEEN /hpf (ref 0–10)

## 2021-08-02 LAB — WET PREP FOR TRICH, YEAST, CLUE
Clue Cell Exam: NEGATIVE
Trichomonas Exam: NEGATIVE
Yeast Exam: NEGATIVE

## 2021-08-02 MED ORDER — SULFAMETHOXAZOLE-TRIMETHOPRIM 800-160 MG PO TABS
1.0000 | ORAL_TABLET | Freq: Two times a day (BID) | ORAL | 0 refills | Status: DC
Start: 2021-08-02 — End: 2021-11-28

## 2021-08-02 NOTE — Patient Instructions (Signed)
one tablet of concentrated cranberry extract (300 to 400 mg) twice daily, or 8 oz of pure unsweetened cranberry juice three times daily

## 2021-08-02 NOTE — Progress Notes (Signed)
Acute Office Visit  Subjective:    Patient ID: [name], female    DOB: [birth year], 66 y.o.   MRN: [id number]  Chief Complaint  Patient presents with   Urinary Tract Infection    Patient states she noticed her urine was really cloudy, right lower back pain, and thinks she noticed some brownish blood in her urine this morning. Patient states the symptoms started about 2 days ago. Patient states she tried over the counter medications for the discomfort and pain, but nothing for UTI symptoms.    Dysuria    HPI Patient is in today for cloudy urine with dysuria and a small amount of blood for 2 days.   URINARY SYMPTOMS  Dysuria: burning Urinary frequency: yes Urgency: yes Small volume voids: yes Symptom severity:  moderate Urinary incontinence: no Foul odor: yes Hematuria: yes Abdominal pain: no Back pain: yes Suprapubic pain/pressure: no Flank pain: no Fever:  no Vomiting: no Relief with cranberry juice:  n/a Relief with pyridium: no Status: worse Previous urinary tract infection: yes Recurrent urinary tract infection: no Treatments attempted: increasing fluids    Past Medical History:  Diagnosis Date   Osteoporosis     Past Surgical History:  Procedure Laterality Date   EXPLORATORY LAPAROTOMY      Family History  Problem Relation Age of Onset   Cancer Mother    Cancer Father    Gallbladder disease Maternal Grandmother        complications   Cancer Maternal Grandfather        stomach    Social History   Socioeconomic History   Marital status: Single    Spouse name: Not on file   Number of children: Not on file   Years of education: Not on file   Highest education level: Not on file  Occupational History   Not on file  Tobacco Use   Smoking status: Never   Smokeless tobacco: Never  Vaping Use   Vaping Use: Never used  Substance and Sexual Activity   Alcohol use: No   Drug use: No   Sexual activity: Never  Other Topics Concern   Not on  file  Social History Narrative   Not on file   Social Determinants of Health   Financial Resource Strain: Not on file  Food Insecurity: Not on file  Transportation Needs: Not on file  Physical Activity: Not on file  Stress: Not on file  Social Connections: Not on file  Intimate Partner Violence: Not on file    Outpatient Medications Prior to Visit  Medication Sig Dispense Refill   aspirin EC 81 MG tablet Take by mouth.     EPINEPHrine (EPIPEN 2-PAK) 0.3 mg/0.3 mL IJ SOAJ injection Inject 0.3 mLs (0.3 mg total) into the muscle as needed for anaphylaxis. 1 each 0   Multiple Vitamin (MULTIVITAMIN) capsule Take 1 capsule by mouth daily.      propranolol (INDERAL) 20 MG tablet Take 20 mg by mouth daily as needed.     clobetasol cream (TEMOVATE) 7.57 % Apply 1 application topically 2 (two) times daily as needed. (Patient not taking: Reported on 08/02/2021) 60 g 1   desonide (DESOWEN) 0.05 % cream Apply topically 2 (two) times daily as needed. (Patient not taking: Reported on 10/12/2020) 60 g 1   sulfamethoxazole-trimethoprim (BACTRIM DS) 800-160 MG tablet Take 1 tablet by mouth 2 (two) times daily. (Patient not taking: Reported on 08/02/2021) 14 tablet 0   No facility-administered medications prior to visit.  Allergies  Allergen Reactions   Iohexol      Code: RASH, Desc: Pt states she sometimes breaks out in a rash from IV contrast.  She needs full premeds.     Review of Systems  Constitutional: Negative.   Respiratory: Negative.    Cardiovascular: Negative.   Gastrointestinal: Negative.   Genitourinary:  Positive for dysuria, frequency, hematuria and urgency.  Musculoskeletal:  Positive for back pain.  Skin: Negative.       Objective:    Physical Exam Vitals and nursing note reviewed.  Constitutional:      General: She is not in acute distress.    Appearance: Normal appearance.  HENT:     Head: Normocephalic.  Eyes:     Conjunctiva/sclera: Conjunctivae normal.   Cardiovascular:     Rate and Rhythm: Normal rate and regular rhythm.     Pulses: Normal pulses.     Heart sounds: Normal heart sounds.  Pulmonary:     Effort: Pulmonary effort is normal.     Breath sounds: Normal breath sounds.  Abdominal:     Palpations: Abdomen is soft.     Tenderness: There is no abdominal tenderness. There is no right CVA tenderness or left CVA tenderness.  Musculoskeletal:     Cervical back: Normal range of motion.  Skin:    General: Skin is warm.  Neurological:     General: No focal deficit present.     Mental Status: She is alert and oriented to person, place, and time.  Psychiatric:        Mood and Affect: Mood normal.        Behavior: Behavior normal.        Thought Content: Thought content normal.        Judgment: Judgment normal.    BP 113/73    Pulse 68    Temp 98.5 F (36.9 C) (Oral)    Ht '5\' 7"'  (1.702 m)    Wt 164 lb 12.8 oz (74.8 kg)    SpO2 100%    BMI 25.81 kg/m  Wt Readings from Last 3 Encounters:  08/02/21 164 lb 12.8 oz (74.8 kg)  10/12/20 159 lb (72.1 kg)  02/09/20 159 lb (72.1 kg)    Health Maintenance Due  Topic Date Due   COVID-19 Vaccine (1) Never done   Pneumonia Vaccine 43+ Years old (1 - PCV) Never done   HIV Screening  Never done   Hepatitis C Screening  Never done   Zoster Vaccines- Shingrix (1 of 2) Never done   TETANUS/TDAP  08/21/2016   PAP SMEAR-Modifier  09/03/2017   MAMMOGRAM  12/20/2019   INFLUENZA VACCINE  Never done   DEXA SCAN  Never done    There are no preventive care reminders to display for this patient.   Lab Results  Component Value Date   TSH 1.260 11/16/2017   Lab Results  Component Value Date   WBC 4.4 04/22/2018   HGB 13.1 04/22/2018   HCT 40.0 04/22/2018   MCV 88 04/22/2018   PLT 209 04/22/2018   No results found for: NA, K, CHLORIDE, CO2, GLUCOSE, BUN, CREATININE, BILITOT, ALKPHOS, AST, ALT, PROT, ALBUMIN, CALCIUM, ANIONGAP, EGFR, GFR No results found for: CHOL No results found for:  HDL No results found for: LDLCALC No results found for: TRIG No results found for: CHOLHDL No results found for: HGBA1C     Assessment & Plan:   Problem List Items Addressed This Visit   None Visit Diagnoses  Acute cystitis with hematuria    -  Primary   Will treat with bactrim DS x7 days. Send urine for culture. Increase fluids. Can take cranberry supplement OTC. F/U if not improving.    Relevant Orders   WET PREP FOR [place], YEAST, CLUE   Urine Culture   Dysuria       U/A positive for few bacteria and 2+ leukocytes   Relevant Orders   Urinalysis, Routine w reflex microscopic        Meds ordered this encounter  Medications   sulfamethoxazole-trimethoprim (BACTRIM DS) 800-160 MG tablet    Sig: Take 1 tablet by mouth 2 (two) times daily.    Dispense:  14 tablet    Refill:  0     Charyl Dancer, NP

## 2021-08-04 LAB — URINE CULTURE

## 2021-11-28 ENCOUNTER — Ambulatory Visit: Payer: BC Managed Care – PPO | Admitting: Physician Assistant

## 2021-11-28 ENCOUNTER — Ambulatory Visit
Admission: RE | Admit: 2021-11-28 | Discharge: 2021-11-28 | Disposition: A | Discharge status: Home / Self Care | Payer: BC Managed Care – PPO | Source: Ambulatory Visit | Attending: Physician Assistant | Admitting: Physician Assistant

## 2021-11-28 ENCOUNTER — Encounter: Payer: Self-pay | Admitting: Physician Assistant

## 2021-11-28 VITALS — BP 126/80 | HR 60 | Temp 98.0°F | Ht 67.01 in | Wt 173.8 lb

## 2021-11-28 DIAGNOSIS — M25562 Pain in left knee: Secondary | ICD-10-CM

## 2021-11-28 NOTE — Progress Notes (Signed)
? ? ? ?     Established Patient Office Visit ? ?Name: [name]   MRN: [id number]    DOB: [birth year]   Date:11/28/2021 ? ?Today's Provider: Jacquelin Hawking, MHS, PA-C ?Introduced myself to the patient as a Secondary school teacher and provided education on APPs in clinical practice.  ? ?      ?Subjective ? ?Chief Complaint ? ?Chief Complaint  ?Patient presents with  ? Knee Pain  ?  Left posterior knee pain for past month. Pain comes and goes ?  ? ? ?HPI ? ?Reports pain (2/10) in her left knee, behind the knee cap ?States pain began about a month ago ?Report she tried taking Ibuprofen with some relief ?Reports it is intermittent and is typically aggravated by activity like mowing the lawn (she push mows her lawn and reports pain is usually worsened about an hour after finishing that activity)  ?Alleviating: warm bath and ibuprofen ?States pain has stayed the same overall ?Unable to recall recent injury to the knee  ? ? ?Patient Active Problem List  ? Diagnosis Date Noted  ? Contact dermatitis 02/05/2019  ? Rosacea, acne 05/14/2018  ? Sacroiliac strain 05/28/2015  ? 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 Nephrolithiasis 09/20/2011  ? OP (osteoporosis) 09/20/2011  ? ? ?Past Surgical History:  ?Procedure Laterality Date  ? EXPLORATORY LAPAROTOMY    ? ? ?Family History  ?Problem Relation Age of Onset  ? Cancer Mother   ? Cancer Father   ? Gallbladder disease Maternal Grandmother   ?     complications  ? Cancer Maternal Grandfather   ?     stomach  ? ? ?Social History  ? ?Tobacco Use  ? Smoking status: Never  ? Smokeless tobacco: Never  ?Substance Use Topics  ? Alcohol use: No  ? ? ? ?Current Outpatient Medications:  ?  aspirin EC 81 MG tablet, Take by mouth., Disp: , Rfl:  ?  Multiple Vitamin (MULTIVITAMIN) capsule, Take 1 capsule by mouth daily. , Disp: , Rfl:  ?  clobetasol cream (TEMOVATE) 0.05 %, Apply 1 application topically 2 (two) times daily as needed. (Patient not taking: Reported on 08/02/2021), Disp: 60 g, Rfl: 1 ?  desonide  (DESOWEN) 0.05 % cream, Apply topically 2 (two) times daily as needed. (Patient not taking: Reported on 10/12/2020), Disp: 60 g, Rfl: 1 ?  EPINEPHrine (EPIPEN 2-PAK) 0.3 mg/0.3 mL IJ SOAJ injection, Inject 0.3 mLs (0.3 mg total) into the muscle as needed for anaphylaxis. (Patient not taking: Reported on 11/28/2021), Disp: 1 each, Rfl: 0 ?  propranolol (INDERAL) 20 MG tablet, Take 20 mg by mouth daily as needed. (Patient not taking: Reported on 11/28/2021), Disp: , Rfl:  ? ?Allergies  ?Allergen Reactions  ? Iohexol   ?   Code: RASH, Desc: Pt states she sometimes breaks out in a rash from IV contrast.  She needs full premeds. ?  ? ? ?I personally reviewed active problem list, medication list, allergies, family history with the patient/caregiver today. ? ? ?Review of Systems  ?Constitutional:  Negative for chills, diaphoresis and fever.  ?Musculoskeletal:  Positive for joint pain. Negative for falls and myalgias.  ?Neurological:  Negative for tingling, sensory change and weakness.  ? ? ? ?Objective ? ?Vitals:  ? 11/28/21 0857 11/28/21 0902  ?BP: (!) 144/83 (!) 145/81  ?Pulse: 67 66  ?Temp: 98 ?F (36.7 ?C)   ?TempSrc: Oral   ?SpO2: 99%   ?Weight: 173 lb 12.8 oz (78.8 kg)   ?Height: 5' 7.01" (1.702  m)   ? ? ?Body mass index is 27.21 kg/m?. ? ?Physical Exam ?Vitals reviewed.  ?Constitutional:   ?   Appearance: Normal appearance.  ?HENT:  ?   Head: Normocephalic and atraumatic.  ?Eyes:  ?   Conjunctiva/sclera: Conjunctivae normal.  ?Pulmonary:  ?   Effort: Pulmonary effort is normal.  ?Musculoskeletal:  ?   Right knee: No swelling, deformity or crepitus. Normal range of motion. No tenderness. No LCL laxity, MCL laxity, ACL laxity or PCL laxity. Normal meniscus.  ?   Instability Tests: Anterior drawer test negative. Posterior drawer test negative. Medial McMurray test negative and lateral McMurray test negative.  ?   Left knee: No swelling, deformity or crepitus. Normal range of motion. No tenderness. No LCL laxity, MCL laxity,  ACL laxity or PCL laxity.Normal meniscus.  ?   Instability Tests: Anterior drawer test negative. Posterior drawer test negative. Medial McMurray test negative and lateral McMurray test negative.  ?Skin: ?   General: Skin is warm and dry.  ?Neurological:  ?   General: No focal deficit present.  ?   Mental Status: She is alert and oriented to person, place, and time.  ?Psychiatric:     ?   Mood and Affect: Mood normal.     ?   Behavior: Behavior normal.     ?   Thought Content: Thought content normal.     ?   Judgment: Judgment normal.  ? ? ? ?No results found for this or any previous visit (from the past 2160 hour(s)). ? ? ?PHQ2/9: ? ?  11/28/2021  ?  8:59 AM 02/09/2020  ?  1:25 PM  ?Depression screen PHQ 2/9  ?Decreased Interest 0 0  ?Down, Depressed, Hopeless 0 0  ?PHQ - 2 Score 0 0  ?Altered sleeping 0 0  ?Tired, decreased energy 0 0  ?Change in appetite 0 0  ?Feeling bad or failure about yourself  0 0  ?Trouble concentrating 0 0  ?Moving slowly or fidgety/restless 0 0  ?Suicidal thoughts 0 0  ?PHQ-9 Score 0 0  ?Difficult doing work/chores Not difficult at all   ?  ? ? ?Fall Risk: ? ?  11/28/2021  ?  8:59 AM 08/02/2021  ?  8:49 AM 02/09/2020  ?  1:25 PM  ?Fall Risk   ?Falls in the past year? 0 0 0  ?Number falls in past yr: 0 0 0  ?Injury with Fall? 0 0 0  ?Risk for fall due to : No Fall Risks No Fall Risks   ?Follow up Falls evaluation completed Falls evaluation completed   ? ? ? ? ? ? ?Assessment & Plan ? ?Problem List Items Addressed This Visit   ?None ?Visit Diagnoses   ? ? Posterior left knee pain    -  Primary ?Acute, new problem for approx 1 month  ?States pain is predominantly in posterior aspect of left knee, aggravated by sustained activity and relieved somewhat with warm soaks and Ibuprofen ?Will order x rays given chronicity and overall persistence despite conservative at-home management with Ibuprofen and warm soaks  ?Recommend using knee brace for stability, can continue ibuprofen and warm compresses  ?DG  knee complete ordered today- results to dictate further management  ?DG knee demonstrates mild- moderate joint effusion and minimum osteoarthritis  ?Recommend she continue with Ibuprofen, warm compresses, and reviewed using a knee brace for stability ?Recommend follow up in 3-4 weeks for evaluation of progress with conservative measures before referring to Orthopedics for management   ?  ?  Relevant Orders  ? DG Knee Complete 4 Views Left  ? ?  ? ? ? ?No follow-ups on file. ? ? ?I, Delma Drone E Xavius Spadafore, PA-C, have reviewed all documentation for this visit. The documentation on 11/28/21 for the exam, diagnosis, procedures, and orders are all accurate and complete. ? ? ?Petr Bontempo, MHS, PA-C ?[facility] ?[facility]  ? ?

## 2021-11-28 NOTE — Patient Instructions (Addendum)
I am sending you for xray imaging of your left knee so we can see what is going on  ?We will keep you updated on the results as they are available ? ?For now continue your at home management with warm compresses, Ibuprofen per manufacturer's instructions, a knee brace and elevation.  ? ? ?Please go to the [facility] located at  ?[address], [place], [place] [address]   ?

## 2021-12-13 ENCOUNTER — Ambulatory Visit: Payer: BC Managed Care – PPO | Admitting: Physician Assistant

## 2021-12-13 ENCOUNTER — Encounter: Payer: Self-pay | Admitting: Physician Assistant

## 2021-12-13 VITALS — BP 119/72 | HR 62 | Temp 98.3°F | Wt 172.8 lb

## 2021-12-13 DIAGNOSIS — L719 Rosacea, unspecified: Secondary | ICD-10-CM | POA: Diagnosis not present

## 2021-12-13 DIAGNOSIS — M25562 Pain in left knee: Secondary | ICD-10-CM | POA: Insufficient documentation

## 2021-12-13 DIAGNOSIS — L03116 Cellulitis of left lower limb: Secondary | ICD-10-CM | POA: Diagnosis not present

## 2021-12-13 MED ORDER — CLOBETASOL PROPIONATE 0.05 % EX CREA: 1.0000 "application " | TOPICAL_CREAM | Freq: Two times a day (BID) | CUTANEOUS | 1 refills | Status: DC | PRN

## 2021-12-13 MED ORDER — CEPHALEXIN 500 MG PO CAPS: 500.0000 mg | ORAL_CAPSULE | Freq: Four times a day (QID) | ORAL | 0 refills | Status: AC

## 2021-12-13 NOTE — Progress Notes (Signed)
Established Patient Office Visit  Name: [name]   MRN: [id number]    DOB: 1955-07-26   Date:12/13/2021  Today's Provider: Jacquelin HawkingErin Remy Dia, MHS, PA-C Introduced myself to the patient as a PA-C and provided education on APPs in clinical practice.         Subjective  Chief Complaint  Chief Complaint  Patient presents with   Knee Pain    Pt states that she started having knee pain 6 weeks ago in her L knee. No known injuries that patient can remember. Patient states that the pain comes and goes.    Insect Bite    Pt states she was bit by something on her L chin. 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   Nephrolithiasis 09/20/2011   OP (osteoporosis) 09/20/2011    Past Surgical History:  Procedure Laterality Date   EXPLORATORY LAPAROTOMY      Family History  Problem Relation Age of Onset   Cancer Mother    Cancer Father    Gallbladder disease Maternal Grandmother        complications   Cancer Maternal Grandfather        stomach    Social History   Tobacco Use   Smoking status: Never   Smokeless tobacco: Never  Substance Use Topics   Alcohol use: No     Current Outpatient Medications:    aspirin EC 81 MG tablet, Take by mouth., Disp: , Rfl:    cephALEXin (KEFLEX) 500 MG capsule, Take 1 capsule (500 mg total) by mouth 4 (four) times daily for 7 days., Disp: 28 capsule, Rfl: 0   Multiple Vitamin (MULTIVITAMIN) capsule, Take 1 capsule by  mouth daily. , Disp: , Rfl:    clobetasol cream (TEMOVATE) 0.05 %, Apply 1 application. topically 2 (two) times daily as needed., Disp: 60 g, Rfl: 1   desonide (DESOWEN) 0.05 % cream, Apply topically 2 (two) times daily as needed. (Patient not taking: Reported on 10/12/2020), Disp: 60 g, Rfl: 1   EPINEPHrine (EPIPEN 2-PAK) 0.3 mg/0.3 mL IJ SOAJ injection, Inject 0.3 mLs (0.3 mg total) into the muscle as needed for anaphylaxis. (Patient not taking: Reported on 11/28/2021), Disp: 1 each, Rfl: 0   propranolol (INDERAL) 20 MG tablet, Take 20 mg by mouth daily as needed. (Patient not taking: Reported on 11/28/2021), Disp: , Rfl:   Allergies  Allergen Reactions   Iohexol      Code: RASH, Desc: Pt states she sometimes breaks out in a rash from IV contrast.  She needs full premeds.        Review of Systems  Musculoskeletal:  Positive for joint pain (left knee pain).  Skin:  Positive for rash.     Objective  Vitals:   12/13/21 0857  BP: 119/72  Pulse: 62  Temp: 98.3 F (36.8 C)  TempSrc: Oral  SpO2: 99%  Weight: 172 lb 12.8 oz (78.4 kg)  Body mass index is 27.06 kg/m.  Physical Exam Vitals reviewed.  Constitutional:      General: She is awake.     Appearance: Normal appearance. She is well-developed, well-groomed and overweight.  HENT:     Head: Normocephalic and atraumatic.  Musculoskeletal:     Right knee: No swelling.     Left knee: Swelling and effusion present. No deformity. Decreased range of motion. Normal alignment.  Skin:    General: Skin is warm.     Findings: Erythema and rash present.     Comments: ~10 cm by 7 cm erythematous rash on left anterior shin with mild swelling   Neurological:     Mental Status: She is alert.  Psychiatric:        Attention and Perception: Attention normal.        Mood and Affect: Affect normal. Mood is anxious.        Speech: Speech normal.        Behavior: Behavior normal. Behavior is cooperative.     No results found for this  or any previous visit (from the past 2160 hour(s)).   PHQ2/9:    11/28/2021    8:59 AM 02/09/2020    1:25 PM  Depression screen PHQ 2/9  Decreased Interest 0 0  Down, Depressed, Hopeless 0 0  PHQ - 2 Score 0 0  Altered sleeping 0 0  Tired, decreased energy 0 0  Change in appetite 0 0  Feeling bad or failure about yourself  0 0  Trouble concentrating 0 0  Moving slowly or fidgety/restless 0 0  Suicidal thoughts 0 0  PHQ-9 Score 0 0  Difficult doing work/chores Not difficult at all       Fall Risk:    11/28/2021    8:59 AM 08/02/2021    8:49 AM 02/09/2020    1:25 PM  Fall Risk   Falls in the past year? 0 0 0  Number falls in past yr: 0 0 0  Injury with Fall? 0 0 0  Risk for fall due to : No Fall Risks No Fall Risks   Follow up Falls evaluation completed Falls evaluation completed       Functional Status Survey:      Assessment & Plan  Problem List Items Addressed This Visit       Other   Posterior left knee pain - Primary    Acute, ongoing Appears to be failing conservative management with continued noticeable effusion  Imaging from previous exam indicates mild to moderate joint effusion with mild osteoarthritic changes to joint space Will refer to ortho for joint aspiration and request sample of aspirate to be sent for gout rule out.  I spent at least 15 additional minutes of apt reviewing proposed joint aspiration and testing for gout per patient preference  Recommend she continue with otc Ibuprofen, gentle stretches, knee brace, warm compresses until evaluated by ortho specialty Follow up as needed.       Relevant Orders   Ambulatory referral to Orthopedic Surgery   Uric acid   Cellulitis of left lower extremity    Acute, new problem Reports she developed erythematous rash along left shin after being bitten by suspected fleas at friend's house Erythematous and tender rash noted along left shin with mild swelling Will provide Keflex 500 mg PO QID x 7 days  to assist with resolution Follow up as needed for persistent or progressing symptoms.        Relevant Medications  cephALEXin (KEFLEX) 500 MG capsule   clobetasol cream (TEMOVATE) 0.05 %     No follow-ups on file.   I, Dulce Martian E Rakiyah Esch, PA-C, have reviewed all documentation for this visit. The documentation on 12/13/21 for the exam, diagnosis, procedures, and orders are all accurate and complete.   Jacquelin Hawking, MHS, PA-C [facility]

## 2021-12-13 NOTE — Patient Instructions (Addendum)
The referral team should call you soon to help set up an apt for Orthopedics Continue the management options we discussed previously until you see them.   I have sent in a script for Cephalexin 500 mg to be taken by mouth every 6 hours for 7 days to assist with the skin infection on your shin You can continue using the clobetasol to assist with itching Keep the area clean and dry and moisturized to prevent skin breakdown Let us know if you have any further concerns or questions

## 2021-12-13 NOTE — Assessment & Plan Note (Addendum)
Acute, ongoing Appears to be failing conservative management with continued noticeable effusion  Imaging from previous exam indicates mild to moderate joint effusion with mild osteoarthritic changes to joint space Will refer to ortho for joint aspiration and request sample of aspirate to be sent for gout rule out.  I spent at least 15 additional minutes of apt reviewing proposed joint aspiration and testing for gout per patient preference  Recommend she continue with otc Ibuprofen, gentle stretches, knee brace, warm compresses until evaluated by ortho specialty Follow up as needed.

## 2021-12-13 NOTE — Assessment & Plan Note (Signed)
Acute, new problem Reports she developed erythematous rash along left shin after being bitten by suspected fleas at friend's house Erythematous and tender rash noted along left shin with mild swelling Will provide Keflex 500 mg PO QID x 7 days to assist with resolution Follow up as needed for persistent or progressing symptoms.

## 2021-12-14 ENCOUNTER — Telehealth: Payer: Self-pay | Admitting: Nurse Practitioner

## 2021-12-14 ENCOUNTER — Telehealth: Payer: Self-pay

## 2021-12-14 LAB — URIC ACID: Uric Acid: 5.2 mg/dL (ref 3.0–7.2)

## 2021-12-14 NOTE — Telephone Encounter (Signed)
Called patient, patient wanted [place] to fax xray report over to [facility]. This has been done already. No further questions at this time.

## 2021-12-14 NOTE — Telephone Encounter (Signed)
Copied from CRM [id number]. Topic: General - Other >> Dec 14, 2021  9:47 AM [name] A wrote: Reason for CRM: The patient would like to speak with Prov. Mecum when possible  The patient has requested expressly and directly to speak with the Provider

## 2021-12-14 NOTE — Telephone Encounter (Signed)
Document has been faxed. 

## 2021-12-14 NOTE — Telephone Encounter (Signed)
Pt has appt in morning with [facility] seeing Dr Okey Dupre. Pt asking for xrays to be sent to that practice.

## 2021-12-15 ENCOUNTER — Encounter: Payer: Self-pay | Admitting: Physician Assistant

## 2021-12-15 ENCOUNTER — Ambulatory Visit: Payer: BC Managed Care – PPO | Admitting: Physician Assistant

## 2021-12-15 VITALS — BP 124/76 | HR 58 | Temp 98.0°F | Wt 173.6 lb

## 2021-12-15 DIAGNOSIS — M25562 Pain in left knee: Secondary | ICD-10-CM

## 2021-12-15 DIAGNOSIS — L03116 Cellulitis of left lower limb: Secondary | ICD-10-CM | POA: Diagnosis not present

## 2021-12-15 DIAGNOSIS — E663 Overweight: Secondary | ICD-10-CM | POA: Insufficient documentation

## 2021-12-15 NOTE — Assessment & Plan Note (Signed)
Acute, resolving Area appears to be less swollen and erythematous as compared to previous visit Continue Abx therapy as prescribed Maintain skin barrier Alert office of changes or concerns

## 2021-12-15 NOTE — Assessment & Plan Note (Addendum)
Likely chronic, ongoing Discussed impact of excess weight on joint and connection this could have with her recent left knee pain Discussed dietary and lifestyle modifications she could make to assist with her desired weight loss Discussed importance of meaningful, healthy changes to achieve goals At least 15 minutes of apt was spent discussing lifestyle changes and impact on overall health.  Follow up as needed.

## 2021-12-15 NOTE — Progress Notes (Signed)
Established Patient Office Visit  Name: [name]   MRN: [id number]    DOB: May 28, 1956   Date:12/15/2021  Today's Provider: Jacquelin Hawking, MHS, PA-C Introduced myself to the patient as a PA-C and provided education on APPs in clinical practice.         Subjective  Chief Complaint  Chief Complaint  Patient presents with   Follow-up    Patient states she is here to discuss her Orthopedic appointment this morning. 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   Nephrolithiasis 09/20/2011   OP (osteoporosis) 09/20/2011    Past Surgical History:  Procedure Laterality Date   EXPLORATORY LAPAROTOMY      Family History  Problem Relation Age of Onset   Cancer Mother    Cancer Father    Gallbladder disease Maternal Grandmother        complications   Cancer Maternal Grandfather        stomach    Social History   Tobacco Use   Smoking status: Never   Smokeless tobacco: Never  Substance Use Topics   Alcohol use: No     Current Outpatient Medications:    aspirin EC 81 MG tablet, Take by mouth., Disp: , Rfl:    cephALEXin (KEFLEX) 500 MG capsule, Take 1 capsule (500 mg total) by mouth 4 (four) times daily for 7 days., Disp: 28 capsule, Rfl: 0   clobetasol cream (TEMOVATE) 0.05 %, Apply 1 application. topically 2 (two) times daily as needed., Disp: 60 g, Rfl: 1   desonide (DESOWEN) 0.05 % cream,  Apply topically 2 (two) times daily as needed., Disp: 60 g, Rfl: 1   EPINEPHrine (EPIPEN 2-PAK) 0.3 mg/0.3 mL IJ SOAJ injection, Inject 0.3 mLs (0.3 mg total) into the muscle as needed for anaphylaxis., Disp: 1 each, Rfl: 0   Multiple Vitamin (MULTIVITAMIN) capsule, Take 1 capsule by mouth daily. , Disp: , Rfl:    propranolol (INDERAL) 20 MG tablet, Take 20 mg by mouth daily as needed., Disp: , Rfl:   Allergies  Allergen Reactions   Iohexol      Code: RASH, Desc: Pt states she sometimes breaks out in a rash from IV contrast.  She needs full premeds.        Review of Systems  Musculoskeletal:  Positive for joint pain (resolving).  Skin:  Positive for rash.     Objective  Vitals:   12/15/21 1412  BP: 124/76  Pulse: (!) 58  Temp: 98 F (36.7 C)  TempSrc: Oral  SpO2: 97%  Weight: 173 lb 9.6 oz (78.7 kg)    Body mass index is 27.18 kg/m.  Physical Exam Vitals reviewed.  Constitutional:      General: She is awake.     Appearance: Normal appearance. She is well-developed, well-groomed and overweight.  Musculoskeletal:     Left knee: No effusion. Normal  range of motion. No tenderness.     Comments: Left knee looks as though it has decreased in size and swelling since previous visit   Skin:    Comments: Cellulitis redness and swelling decreased over left shin  Skin intact over anterior shin.   Neurological:     Mental Status: She is alert.  Psychiatric:        Attention and Perception: Attention normal.        Mood and Affect: Mood and affect normal.        Speech: Speech normal.        Behavior: Behavior normal. Behavior is cooperative.     Recent Results (from the past 2160 hour(s))  Uric acid     Status: None   Collection Time: 12/13/21  9:52 AM  Result Value Ref Range   Uric Acid 5.2 3.0 - 7.2 mg/dL    Comment:            Therapeutic target for gout patients: <6.0     PHQ2/9:    12/15/2021    2:13 PM 11/28/2021    8:59 AM 02/09/2020    1:25 PM   Depression screen PHQ 2/9  Decreased Interest 0 0 0  Down, Depressed, Hopeless 0 0 0  PHQ - 2 Score 0 0 0  Altered sleeping 0 0 0  Tired, decreased energy 0 0 0  Change in appetite 0 0 0  Feeling bad or failure about yourself  0 0 0  Trouble concentrating 0 0 0  Moving slowly or fidgety/restless 0 0 0  Suicidal thoughts 0 0 0  PHQ-9 Score 0 0 0  Difficult doing work/chores  Not difficult at all       Fall Risk:    12/15/2021    2:13 PM 11/28/2021    8:59 AM 08/02/2021    8:49 AM 02/09/2020    1:25 PM  Fall Risk   Falls in the past year? 0 0 0 0  Number falls in past yr: 0 0 0 0  Injury with Fall? 0 0 0 0  Risk for fall due to : No Fall Risks No Fall Risks No Fall Risks   Follow up Falls evaluation completed Falls evaluation completed Falls evaluation completed       Functional Status Survey:      Assessment & Plan  Problem List Items Addressed This Visit       Other   Posterior left knee pain    Acute, resolving She saw ortho today and stated she noticed a popping sensation on Tues afternoon which resulted in relief  Ortho eval did not indicate need for intervention at this time Follow up for recurrent or worsening symptoms.        Cellulitis of left lower extremity    Acute, resolving Area appears to be less swollen and erythematous as compared to previous visit Continue Abx therapy as prescribed Maintain skin barrier Alert office of changes or concerns        Overweight (BMI 25.0-29.9) - Primary    Likely chronic, ongoing Discussed impact of excess weight on joint and connection this could have with her recent left knee pain Discussed dietary and lifestyle modifications she could make to assist with her desired weight loss Discussed importance of meaningful, healthy changes to achieve goals At least 15 minutes of apt was spent discussing lifestyle changes and impact on overall health.  Follow up as needed.  No follow-ups on  file.   I, Laloni Rowton E Bandy Honaker, PA-C, have reviewed all documentation for this visit. The documentation on 12/15/21 for the exam, diagnosis, procedures, and orders are all accurate and complete.   Jacquelin HawkingErin Rashi Giuliani, MHS, PA-C [facility]

## 2021-12-15 NOTE — Assessment & Plan Note (Signed)
Acute, resolving She saw ortho today and stated she noticed a popping sensation on Tues afternoon which resulted in relief  Ortho eval did not indicate need for intervention at this time Follow up for recurrent or worsening symptoms.

## 2022-03-17 ENCOUNTER — Encounter: Payer: Self-pay | Admitting: Nurse Practitioner

## 2022-03-24 DIAGNOSIS — M25562 Pain in left knee: Secondary | ICD-10-CM | POA: Insufficient documentation

## 2022-11-10 IMAGING — DX DG KNEE COMPLETE 4+V*L*
4 series · 4 of 4 positions shown · non-contrast
Comparison: None Available.

CLINICAL DATA: Left knee pain for 1 month.

EXAM:
LEFT KNEE - COMPLETE 4+ VIEW

[view not recorded (1 of 2)]
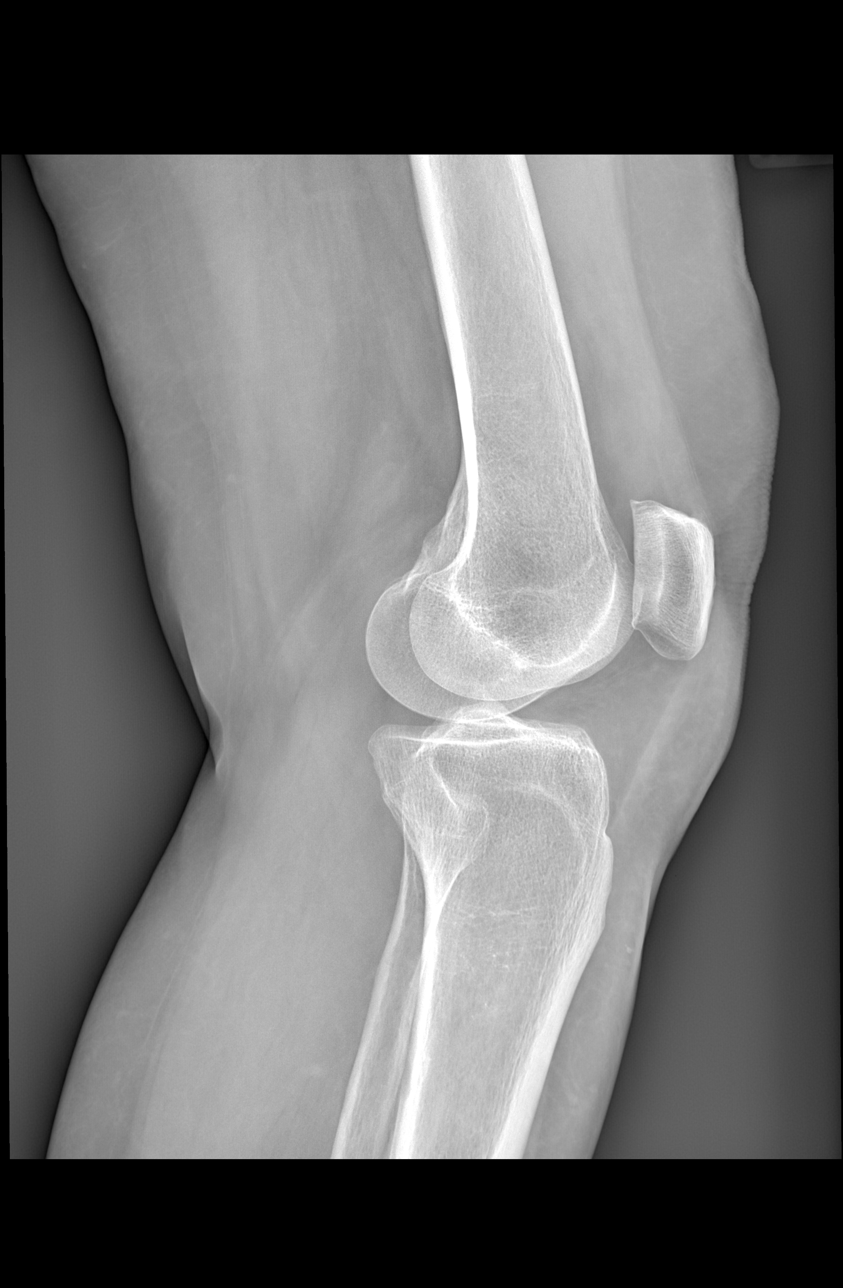

[AP (1 of 2)]
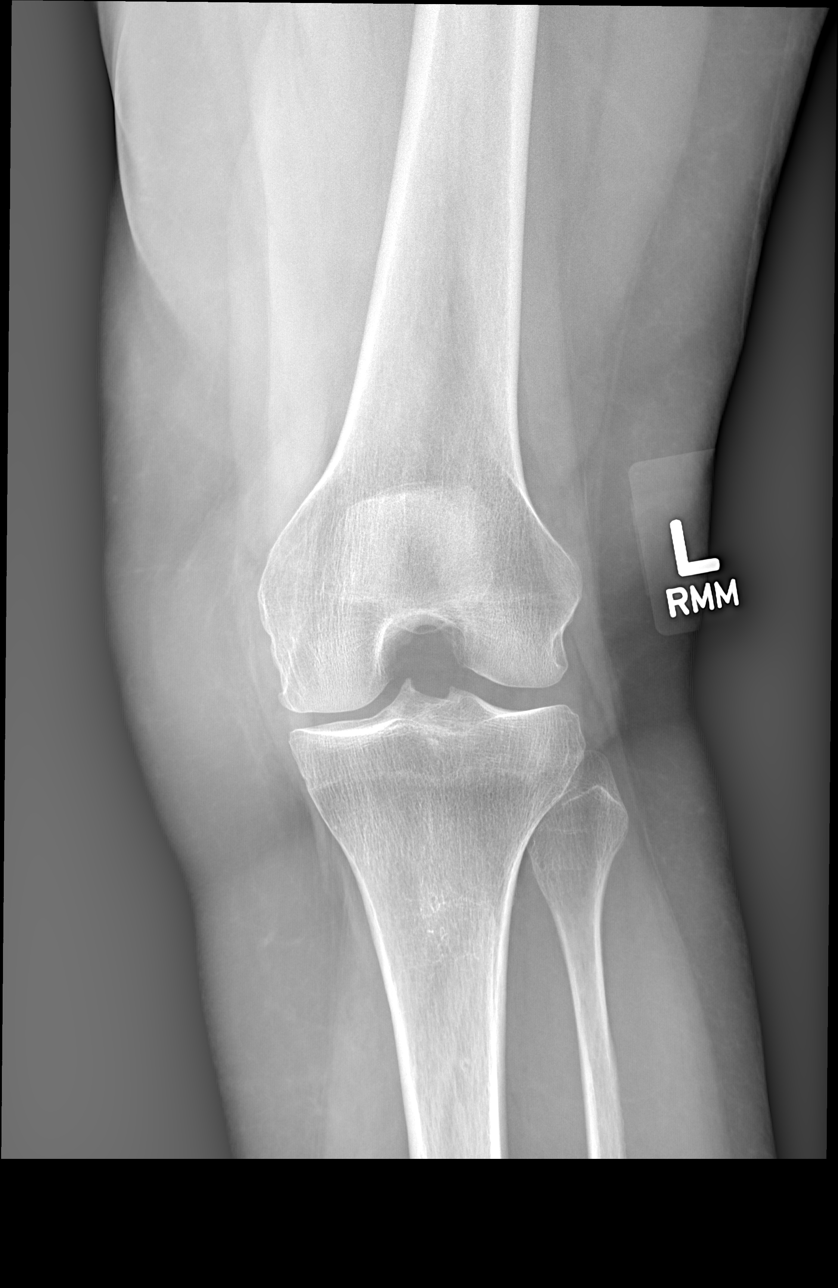

[AP (2 of 2)]
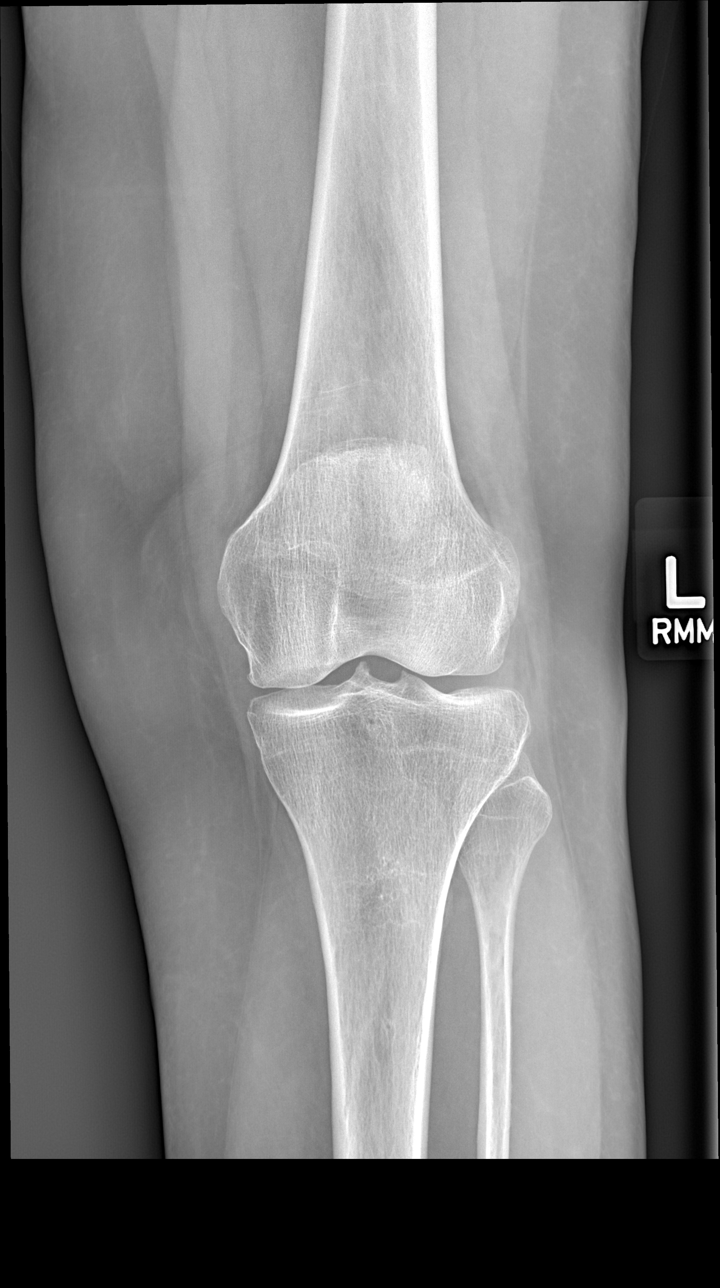

[view not recorded (2 of 2)]
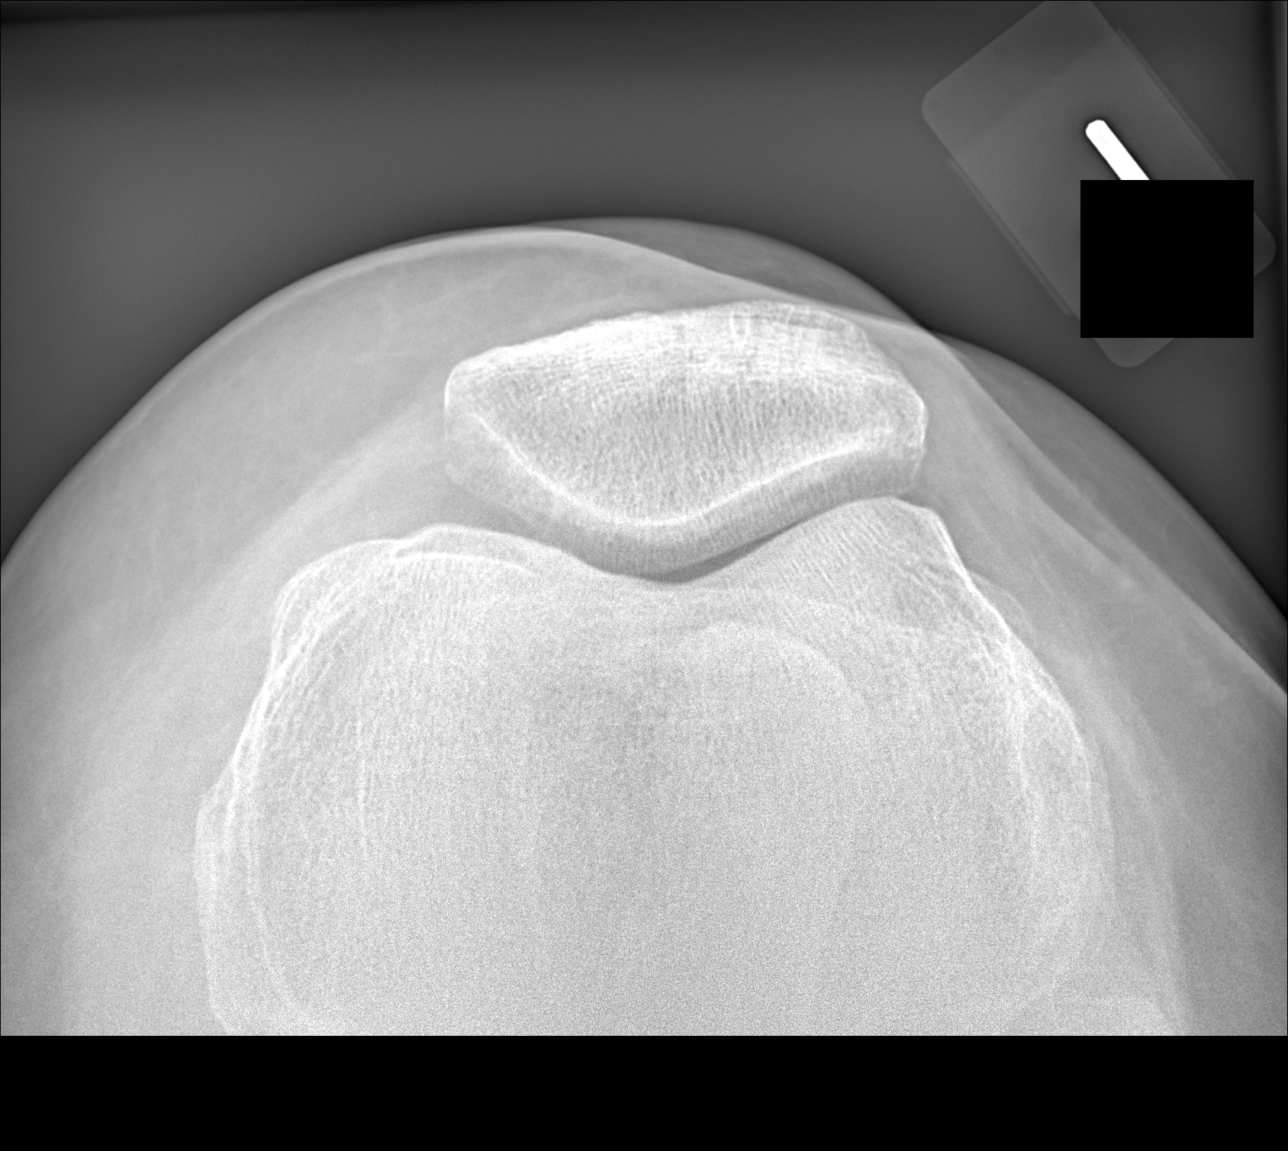

[4 of 4 positions shown; findings below may reference images not displayed]

FINDINGS: Minimal peripheral medial compartment degenerative spurring. Mild
superior patellar degenerative osteophytosis. Mild-to-moderate joint
effusion. No acute fracture or dislocation.
IMPRESSION: Minimal medial and patellofemoral compartment osteoarthritis.

## 2023-05-22 ENCOUNTER — Ambulatory Visit: Payer: BC Managed Care – PPO | Admitting: Pediatrics

## 2023-05-22 ENCOUNTER — Encounter: Payer: Self-pay | Admitting: Pediatrics

## 2023-05-22 ENCOUNTER — Ambulatory Visit: Payer: Self-pay

## 2023-05-22 VITALS — BP 120/80 | HR 71 | Temp 97.9°F | Wt 177.4 lb

## 2023-05-22 DIAGNOSIS — F418 Other specified anxiety disorders: Secondary | ICD-10-CM | POA: Diagnosis not present

## 2023-05-22 DIAGNOSIS — R1011 Right upper quadrant pain: Secondary | ICD-10-CM | POA: Diagnosis not present

## 2023-05-22 DIAGNOSIS — R350 Frequency of micturition: Secondary | ICD-10-CM

## 2023-05-22 DIAGNOSIS — Z1330 Encounter for screening examination for mental health and behavioral disorders, unspecified: Secondary | ICD-10-CM

## 2023-05-22 LAB — URINALYSIS, ROUTINE W REFLEX MICROSCOPIC
Bilirubin, UA: NEGATIVE
Glucose, UA: NEGATIVE
Leukocytes,UA: NEGATIVE
Nitrite, UA: NEGATIVE
Protein,UA: NEGATIVE
Specific Gravity, UA: 1.025 (ref 1.005–1.030)
Urobilinogen, Ur: 0.2 mg/dL (ref 0.2–1.0)
pH, UA: 6 (ref 5.0–7.5)

## 2023-05-22 LAB — MICROSCOPIC EXAMINATION
Bacteria, UA: NONE SEEN
WBC, UA: NONE SEEN /[HPF] (ref 0–5)

## 2023-05-22 MED ORDER — PROPRANOLOL HCL 20 MG PO TABS: 20.0000 mg | ORAL_TABLET | Freq: Every day | ORAL | 1 refills | Status: DC | PRN

## 2023-05-22 NOTE — Telephone Encounter (Signed)
Chief Complaint: right upper chest pain Symptoms: crampy before BM's and worse after eating Frequency: 10 days Pertinent Negatives: Patient denies chest pain or SOB Disposition: [] ED /[] Urgent Care (no appt availability in office) / [x] Appointment(In office/virtual)/ []  [facility]/ [] Home Care/ [] Refused Recommended Disposition /[] [facility]/ []  Follow-up with PCP Additional Notes: appt made for today Reason for Disposition  [1] MILD pain (e.g., does not interfere with normal activities) AND [2] pain comes and goes (cramps) AND [3] present > 48 hours  (Exception: This same abdominal pain is a chronic symptom recurrent or ongoing AND present > 4 weeks.)  Answer Assessment - Initial Assessment Questions 1. LOCATION: "Where does it hurt?"      Right chest pain  2. RADIATION: "Does the pain shoot anywhere else?" (e.g., chest, back)     no 3. ONSET: "When did the pain begin?" (e.g., minutes, hours or days ago)      10 days ago  4. SUDDEN: "Gradual or sudden onset?"     Suddenly  5. PATTERN "Does the pain come and go, or is it constant?"    - If it comes and goes: "How long does it last?" "Do you have pain now?"     (Note: Comes and goes means the pain is intermittent. It goes away completely between bouts.)    - If constant: "Is it getting better, staying the same, or getting worse?"      (Note: Constant means the pain never goes away completely; most serious pain is constant and gets worse.)      Comes and goes 6. SEVERITY: "How bad is the pain?"  (e.g., Scale 1-10; mild, moderate, or severe)    - MILD (1-3): Doesn't interfere with normal activities, abdomen soft and not tender to touch.     - MODERATE (4-7): Interferes with normal activities or awakens from sleep, abdomen tender to touch.     - SEVERE (8-10): Excruciating pain, doubled over, unable to do any normal activities.       mild  8. CAUSE: "What do you think is causing the stomach pain?"      diverticulitis 9. RELIEVING/AGGRAVATING FACTORS: "What makes it better or worse?" (e.g., antacids, bending or twisting motion, bowel movement)     After a BM or worse after eating  10. OTHER SYMPTOMS: "Do you have any other symptoms?" (e.g., back pain, diarrhea, fever, urination pain, vomiting)       ? Low grade fever 11. PREGNANCY: "Is there any chance you are pregnant?" "When was your last menstrual period?"       N/a  Answer Assessment - Initial Assessment Questions 1. LOCATION: "Where does it hurt?"       Right upper next to sternum  2. RADIATION: "Does the pain go anywhere else?" (e.g., into neck, jaw, arms, back) no 3. ONSET: "When did the chest pain begin?" (Minutes, hours or days)      1 week 4. PATTERN: "Does the pain come and go, or has it been constant since it started?"  "Does it get worse with exertion?"      Comes and goes  5. DURATION: "How long does it last" (e.g., seconds, minutes, hours)     Before BM or after eating 6. SEVERITY: "How bad is the pain?"  (e.g., Scale 1-10; mild, moderate, or severe)    - MILD (1-3): doesn't interfere with normal activities     - MODERATE (4-7): interferes with normal activities or awakens from sleep    -  SEVERE (8-10): excruciating pain, unable to do any normal activities       mild 9. CAUSE: "What do you think is causing the chest pain?"     diverticulitis 10. OTHER SYMPTOMS: "Do you have any other symptoms?" (e.g., dizziness, nausea, vomiting, sweating, fever, difficulty breathing, cough)       cramping 11. PREGNANCY: "Is there any chance you are pregnant?" "When was your last menstrual period?"       N/a  Protocols used: Abdominal Pain - Female-A-AH, Chest Pain-A-AH

## 2023-05-22 NOTE — Progress Notes (Unsigned)
Acute Visit  BP 120/80   Pulse 71   Temp 97.9 F (36.6 C) (Oral)   Wt 177 lb 6.4 oz (80.5 kg)   SpO2 100%   BMI 27.78 kg/m    Subjective:    Patient ID: [name], female    DOB: Mar 27, 1956, 67 y.o.   MRN: [id number]  HPI: [name] is a 67 y.o. female  Chief Complaint  Patient presents with   Pain    Right breast pain , that comes and goes feels better once having a BM   Discussed the use of AI scribe software for clinical note transcription with the patient, who gave verbal consent to proceed.  History of Present Illness   The patient, with a history of a tibial fracture and subsequent weight gain, presented with a chief complaint of intermittent right upper quadrant abdominal pain for approximately the last ten days. The pain is localized beneath the right breast and to the right of the sternum. The patient noted that the pain improves following a bowel movement and after adopting a liquid diet. However, the pain returned after consuming solid food. The patient denied any nocturnal pain or pain triggered by movement.  The patient had been taking over-the-counter Prilosec (omeprazole) for acid reflux but had recently discontinued it due to the need to resume alendronate for bone health. The patient resumed Prilosec in the last couple of days, which seemed to provide some relief.  The patient reported a change in bowel habits, with an episode of sudden diarrhea two days prior, which was unusual for their typically regular bowel movements. There were no reported changes in appetite, no blood in the stool, and no cough. The patient denied any pain with deep breathing.  The patient also reported a recent trial of an over-the-counter weight loss supplement, Liposom (semaglutide), which seemed to exacerbate the abdominal pain. The patient has a family history of diverticulitis in two brothers but denied any personal history of the condition.  The patient has a history of a  tibial fracture about a year and a half ago, which led to a period of increased sedentary behavior and subsequent weight gain. The patient expressed concern about the weight gain and a desire to address it.  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 The patient denied any pain or discomfort while playing the flute.      Relevant past medical, surgical, family and social history reviewed and updated as indicated. Interim medical history since our last visit reviewed. Allergies and medications reviewed and updated.  ROS per HPI unless specifically indicated above     Objective:    BP 120/80   Pulse 71   Temp 97.9 F (36.6 C) (Oral)   Wt 177 lb 6.4 oz (80.5 kg)   SpO2 100%   BMI 27.78 kg/m   Wt Readings from Last 3 Encounters:  05/22/23 177 lb 6.4 oz (80.5 kg)  12/15/21 173 lb 9.6 oz (78.7 kg)  12/13/21 172 lb 12.8 oz (78.4 kg)    Physical Exam Constitutional:      Appearance: Normal appearance.  HENT:     Head: Normocephalic and atraumatic.  Eyes:     Pupils: Pupils are equal, round, and reactive to light.  Cardiovascular:     Rate and Rhythm: Normal rate and regular rhythm.     Pulses: Normal pulses.     Heart sounds: Normal heart sounds.  Pulmonary:  Effort: Pulmonary effort is normal.     Breath sounds: Normal breath sounds.  Abdominal:     General: Abdomen is flat. Bowel sounds are normal. There is no distension.     Palpations: Abdomen is soft. There is no mass.     Tenderness: There is no abdominal tenderness. There is no right CVA tenderness, left CVA tenderness, guarding or rebound.     Hernia: No hernia is present.     Comments: Negative murphy's  Musculoskeletal:        General: Normal range of motion.  Skin:    General: Skin is warm and dry.     Capillary Refill: Capillary refill takes less than 2 seconds.  Neurological:     General: No focal deficit present.     Mental  Status: She is alert. Mental status is at baseline.  Psychiatric:        Mood and Affect: Mood normal.        Behavior: Behavior normal.       05/22/2023   11:33 AM 05/22/2023   11:32 AM 12/15/2021    2:13 PM 11/28/2021    8:59 AM 02/09/2020    1:25 PM  Depression screen PHQ 2/9  Decreased Interest 0 0 0 0 0  Down, Depressed, Hopeless 0 0 0 0 0  PHQ - 2 Score 0 0 0 0 0  Altered sleeping 0  0 0 0  Tired, decreased energy 0  0 0 0  Change in appetite 0  0 0 0  Feeling bad or failure about yourself  0  0 0 0  Trouble concentrating 0  0 0 0  Moving slowly or fidgety/restless 0  0 0 0  Suicidal thoughts 0  0 0 0  PHQ-9 Score 0  0 0 0  Difficult doing work/chores Not difficult at all   Not difficult at all       05/22/2023   11:33 AM 12/15/2021    2:13 PM 11/28/2021    9:00 AM  GAD 7 : Generalized Anxiety Score  Nervous, Anxious, on Edge 0 0 0  Control/stop worrying 0 0 0  Worry too much - different things 0 0 0  Trouble relaxing 0 0 0  Restless 0 0 0  Easily annoyed or irritable 0 0 0  Afraid - awful might happen 0 0 0  Total GAD 7 Score 0 0 0  Anxiety Difficulty Not difficult at all Not difficult at all        Assessment & Plan:  Assessment & Plan   RUQ pain Intermittent pain for approximately 10 days, located beneath the right breast and to the right of the sternum. Pain improves after bowel movements and with a liquid diet. No nocturnal pain or movement-triggered pain. No history of abdominal surgery. Suspect biliary colic and/or other gallbladder issue vs MSK related. Low likelihood from referred pain from GERD though possible. Exam reassuring. Patient was previously on Prilosec (omeprazole) but stopped due to need to take alendronate. Recently restarted Prilosec with some improvement in symptoms. -Continue Prilosec as needed. -Consider ultrasound of liver and gallbladder based on lab results. -     Comprehensive metabolic panel -     Lipase  Urinary frequency Chronic.  Will r/o referred pain from kidney stone (h/o kidney stones in the past). -     Urinalysis, Routine w reflex microscopic  Performance anxiety Chronic. Well controlled w prn beta blocker. Refills sent. -     Propranolol HCl; Take  1 tablet (20 mg total) by mouth daily as needed.  Dispense: 30 tablet; Refill: 1  Encounter for behavioral health screening As part of their intake evaluation, the patient was screened for depression, anxiety.  PHQ9 SCORE 0, GAD7 SCORE 0. Screening results negative for tested conditions.    Other orders -     Microscopic Examination  Follow up plan: Return in about 2 months (around 07/22/2023) for Physical.  Laniah Grimm Howell Pringle, MD

## 2023-05-22 NOTE — Patient Instructions (Addendum)
Good to meet you!   As your primary care doctor, I look forward to working with you to help you reach your health goals.  Please be aware of a couple of logistical items: - If you message me on mychart, it may take me 1-2 business days to get back to you. This is for non-urgent messaging.  - If you require urgent clinical attention, please call the clinic or present to urgent care/emergency room - If you have labs, I typically will send a message about them in 1-2 business days. - I am not here on Mondays, otherwise will be available from Tuesday-Friday during 8a-5pm.

## 2023-05-23 ENCOUNTER — Other Ambulatory Visit: Payer: Self-pay | Admitting: Pediatrics

## 2023-05-23 ENCOUNTER — Encounter: Payer: Self-pay | Admitting: Pediatrics

## 2023-05-23 DIAGNOSIS — R1011 Right upper quadrant pain: Secondary | ICD-10-CM

## 2023-05-23 LAB — COMPREHENSIVE METABOLIC PANEL
ALT: 21 [IU]/L (ref 0–32)
AST: 26 [IU]/L (ref 0–40)
Albumin: 4.6 g/dL (ref 3.9–4.9)
Alkaline Phosphatase: 131 [IU]/L — ABNORMAL HIGH (ref 44–121)
BUN/Creatinine Ratio: 25 (ref 12–28)
BUN: 22 mg/dL (ref 8–27)
Bilirubin Total: 0.3 mg/dL (ref 0.0–1.2)
CO2: 23 mmol/L (ref 20–29)
Calcium: 9.6 mg/dL (ref 8.7–10.3)
Chloride: 102 mmol/L (ref 96–106)
Creatinine, Ser: 0.89 mg/dL (ref 0.57–1.00)
Globulin, Total: 2.6 g/dL (ref 1.5–4.5)
Glucose: 89 mg/dL (ref 70–99)
Potassium: 4 mmol/L (ref 3.5–5.2)
Sodium: 142 mmol/L (ref 134–144)
Total Protein: 7.2 g/dL (ref 6.0–8.5)
eGFR: 71 mL/min/{1.73_m2} (ref >59)

## 2023-05-23 LAB — LIPASE: Lipase: 14 U/L (ref 14–72)

## 2023-05-23 NOTE — Progress Notes (Signed)
RUQ Ultrasound given rise in alk phos and RUQ pain.

## 2023-05-24 ENCOUNTER — Ambulatory Visit
Admission: RE | Admit: 2023-05-24 | Discharge: 2023-05-24 | Disposition: A | Discharge status: Home / Self Care | Payer: BC Managed Care – PPO | Source: Ambulatory Visit | Attending: Pediatrics | Admitting: Pediatrics

## 2023-05-24 DIAGNOSIS — R1011 Right upper quadrant pain: Secondary | ICD-10-CM | POA: Diagnosis present

## 2023-07-20 ENCOUNTER — Encounter: Payer: Self-pay | Admitting: Pediatrics

## 2023-07-20 ENCOUNTER — Ambulatory Visit (INDEPENDENT_AMBULATORY_CARE_PROVIDER_SITE_OTHER): Payer: BC Managed Care – PPO | Admitting: Pediatrics

## 2023-07-20 VITALS — BP 130/80 | HR 58 | Temp 98.1°F | Resp 14 | Ht 67.0 in | Wt 176.9 lb

## 2023-07-20 DIAGNOSIS — R030 Elevated blood-pressure reading, without diagnosis of hypertension: Secondary | ICD-10-CM

## 2023-07-20 DIAGNOSIS — M810 Age-related osteoporosis without current pathological fracture: Secondary | ICD-10-CM

## 2023-07-20 DIAGNOSIS — Z0000 Encounter for general adult medical examination without abnormal findings: Secondary | ICD-10-CM | POA: Diagnosis not present

## 2023-07-20 DIAGNOSIS — R001 Bradycardia, unspecified: Secondary | ICD-10-CM | POA: Insufficient documentation

## 2023-07-20 DIAGNOSIS — Z131 Encounter for screening for diabetes mellitus: Secondary | ICD-10-CM

## 2023-07-20 DIAGNOSIS — R3121 Asymptomatic microscopic hematuria: Secondary | ICD-10-CM | POA: Insufficient documentation

## 2023-07-20 DIAGNOSIS — Z13220 Encounter for screening for lipoid disorders: Secondary | ICD-10-CM

## 2023-07-20 DIAGNOSIS — Z1159 Encounter for screening for other viral diseases: Secondary | ICD-10-CM

## 2023-07-20 DIAGNOSIS — Z1330 Encounter for screening examination for mental health and behavioral disorders, unspecified: Secondary | ICD-10-CM

## 2023-07-20 DIAGNOSIS — R748 Abnormal levels of other serum enzymes: Secondary | ICD-10-CM

## 2023-07-20 LAB — CBC
Hematocrit: 41 % (ref 34.0–46.6)
Hemoglobin: 13.6 g/dL (ref 11.1–15.9)
MCH: 28.3 pg (ref 26.6–33.0)
MCHC: 33.2 g/dL (ref 31.5–35.7)
MCV: 85 fL (ref 79–97)
Platelets: 233 10*3/uL (ref 150–450)
RBC: 4.81 x10E6/uL (ref 3.77–5.28)
RDW: 14 % (ref 11.7–15.4)
WBC: 5.2 10*3/uL (ref 3.4–10.8)

## 2023-07-20 MED ORDER — ALENDRONATE SODIUM 70 MG PO TABS: 70.0000 mg | ORAL_TABLET | ORAL | 3 refills | Status: DC

## 2023-07-20 NOTE — Patient Instructions (Addendum)
Don't forget to schedule your colonoscopy Don't forget to schedule your mammogram this year  Vaccines you are due for: Shingles Pneumonia Tetanus (Tdap)  Please let me know if you have any questions about these. You can always schedule these as a nurse visit.  Measure your blood pressure at home! Home Blood Pressure Monitoring is an important part of managing blood pressure and thought to be more accurate than the measures we get in the clinic.  Here's some tips on how to take your blood pressure accurately at home and some highly rated monitors. Most insurances (except for Medicaid) won't pay for monitors, so unfortunately they are an out-of-pocket expense for most people.  Taking an accurate blood pressure measurement: To get an accurate blood pressure reading, empty your bladder first, then rest in a seated position for at least 5 minutes. Ideally, no caffeine or tobacco use in last 30 minutes. Use an arm cuff (not wrist - see recommendations below) seated in a chair with a back next to a table or object that is high enough that you can rest your arm so the blood pressure cuff is at the level of your heart and you can lean back comfortably. Keep both feet on the floor and don't talk while the machine is working. Checking at different times of the day can be helpful to get an idea of your average numbers. Your goal blood pressure should be <140/90. BP Monitor Ratings: Here are some top rated Blood Pressure Monitors as tested by Consumer Reports (accessed January, 2024): (*BB) Best buy = highly rated with lower price Rating Brand/Model Estimated Cost  86 [employer]ron Platinum BP5450 $79  85 Omron Silver BP5250 (*BB)  $53  84 Omron 10 Series BP7450 $92  83 Omron Evolv BP7000 $110  81 [employer] [id number] $52  80 Omron 3 Series BP7100 $50  78 [employer]Health [id number] $40  76 Up & Up ([employer]) Automatic Upper Arm 48-554 $30  Note: all units listed above are arm cuffs which are more accurate than wrist  cuffs. For more information (and the source of the below info-graphic on how to get set up to get an accurate reading) - check out: [employer]be

## 2023-07-20 NOTE — Assessment & Plan Note (Signed)
Managed at [facility]. Last DEX 2023. Requesting refills to last until ortho follow up in the summery at same dose. No recent fractures.

## 2023-07-20 NOTE — Progress Notes (Signed)
BP 130/80 (BP Location: Left Arm, Cuff Size: Normal)   Pulse (!) 58   Temp 98.1 F (36.7 C) (Oral)   Resp 14   Ht 5\' 7"  (1.702 m)   Wt 176 lb 14.4 oz (80.2 kg)   SpO2 98%   BMI 27.71 kg/m    Annual Physical Exam - Female  Subjective:   CC: Annual Exam   [name] is a 67 y.o. female patient here for a preventative health maintenance exam and has no acute complaints.  Health Habits: DIET: in general, a "healthy" diet   EXERCISE: MWF times/week on average, lifts weight as well DENTAL EXAM: Up to Date EYE EXAM: Up to Date                       Relevant Gynecologic History LMP: No LMP recorded. Patient is postmenopausal.  Menstrual Status: postmenopausal, Flow post-menopausal PAP History:  Result Date Procedure Results Follow-ups  09/03/2014 HM PAP SMEAR HM Pap smear: normal     History abnormal PAP: No, aged out. She will go see gyn in the next year. Family history breast, ovarian cancer: No Domestic Violence Screen, feels safe at home: Yes  family history includes Cancer in her father, maternal grandfather, and mother; Gallbladder disease in her maternal grandmother.  Social History   Tobacco Use   Smoking status: Never   Smokeless tobacco: Never  Vaping Use   Vaping status: Never Used  Substance Use Topics   Alcohol use: No   Drug use: No   Social History   Social History Narrative   Not on file    Social drivers questionnaire is reviewed and is positive for : none  Depression Screening:     07/20/2023    1:05 PM 05/22/2023   11:33 AM 05/22/2023   11:32 AM 12/15/2021    2:13 PM 11/28/2021    8:59 AM  Depression screen PHQ 2/9  Decreased Interest 0 0 0 0 0  Down, Depressed, Hopeless 0 0 0 0 0  PHQ - 2 Score 0 0 0 0 0  Altered sleeping 0 0  0 0  Tired, decreased energy 0 0  0 0  Change in appetite 0 0  0 0  Feeling bad or failure about yourself  0 0  0 0  Trouble concentrating 0 0  0 0  Moving slowly or fidgety/restless 0 0  0 0  Suicidal  thoughts 0 0  0 0  PHQ-9 Score 0 0  0 0  Difficult doing work/chores Not difficult at all Not difficult at all   Not difficult at all       05/22/2023   11:33 AM 12/15/2021    2:13 PM 11/28/2021    9:00 AM  GAD 7 : Generalized Anxiety Score  Nervous, Anxious, on Edge 0 0 0  Control/stop worrying 0 0 0  Worry too much - different things 0 0 0  Trouble relaxing 0 0 0  Restless 0 0 0  Easily annoyed or irritable 0 0 0  Afraid - awful might happen 0 0 0  Total GAD 7 Score 0 0 0  Anxiety Difficulty Not difficult at all Not difficult at all     Mental Health Plan:  none, continue to monitor  Self Management Goals  Goals   None     Health Maintenance Colon Cancer Screening : Due - will schedule at [facility] Mammogram : Due - will schedule at [facility] DXA scan :  Up to Date, osteoporosis on fosamax. Requesting refills. Immunizations : declines  Review of Systems See HPI for relevant ROS.  Outpatient Medications Prior to Visit  Medication Sig Dispense Refill   aspirin EC 81 MG tablet Take by mouth.     cetirizine (ZYRTEC) 10 MG tablet Take 10 mg by mouth daily.     clobetasol cream (TEMOVATE) 0.05 % Apply 1 application. topically 2 (two) times daily as needed. 60 g 1   desonide (DESOWEN) 0.05 % cream Apply topically 2 (two) times daily as needed. 60 g 1   EPINEPHrine (EPIPEN 2-PAK) 0.3 mg/0.3 mL IJ SOAJ injection Inject 0.3 mLs (0.3 mg total) into the muscle as needed for anaphylaxis. 1 each 0   GLUCOS-CHON-MSM-CA-C-CTCL-SECU PO Take 2 each by mouth.     Multiple Minerals-Vitamins (CALCIUM & VIT D3 BONE HEALTH PO) Take 2 each by mouth.     Multiple Vitamin (MULTIVITAMIN) capsule Take 1 capsule by mouth daily.      Multiple Vitamins-Minerals (CENTRUM SILVER 50+WOMEN) TABS Take 1 each by mouth.     omeprazole (PRILOSEC) 20 MG capsule Take 20 mg by mouth daily.     propranolol (INDERAL) 20 MG tablet Take 1 tablet (20 mg total) by mouth daily as needed. 30 tablet 1   UNABLE TO FIND Take 3  each by mouth once. Med Name: Tummeric gummies, OTC     alendronate (FOSAMAX) 70 MG tablet Take 70 mg by mouth once a week.     No facility-administered medications prior to visit.     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  Physical Exam Constitutional:      Appearance: Normal appearance.  HENT:     Head: Normocephalic and atraumatic.  Eyes:     Pupils: Pupils are equal, round, and reactive to light.  Cardiovascular:     Rate and Rhythm: Normal rate and regular rhythm.     Pulses: Normal pulses.     Heart sounds: Normal heart sounds.  Pulmonary:     Effort: Pulmonary effort is normal.     Breath sounds: Normal breath sounds.  Abdominal:     General: Abdomen is flat.     Palpations: Abdomen is soft.  Musculoskeletal:        General: Normal range of motion.     Cervical back: Normal range of motion.  Skin:    General: Skin is warm and dry.     Capillary Refill: Capillary refill takes less than 2 seconds.  Neurological:     General: No focal deficit present.     Mental Status: She is alert. Mental status is at baseline.  Psychiatric:  Mood and Affect: Mood normal.        Behavior: Behavior normal.    Assessment and Plan:   Annual  physical exam Discussed lifestyle modifications and goals including plant based eating styles (such as: Mediterranean eating style), regular exercise (at least 150 min of moderate-intensity aerobic exercise per week, given AHA workout handout), get adequate sleep, and continue working with PCP towards meeting health goals to ensure healthy aging.  -     CBC  Osteoporosis, post-menopausal Assessment & Plan: Managed at [facility]. Last DEX 2023. Requesting refills to last until ortho follow up in the summery at same dose. No recent fractures.  Orders: -     Alendronate Sodium; Take 1 tablet (70 mg total) by mouth once a week.  Dispense: 12 tablet; Refill: 3  Asymptomatic microscopic hematuria Assessment & Plan: H/o kidney stones. She was noted to have 2+ RBC on last u/a. She reports she was told she would have this with age. Explained possible etiologies and recommendation to repeat. Will consider with future labs. Declines urology evaluation.    Heart rate slow Assessment & Plan: Chronic. Suspect from active lifestyle. Continue to monitor.   Elevated blood pressure reading Noted today. Instructed to repeat at home. Will continue to monitor.  Elevated alkaline phosphatase level Noted on recent blood work. Liver u/s negative. Will repeat in 6 months. Suspect related to known osteoporosis.  Encounter for behavioral health screening As part of their intake evaluation, the patient was screened for depression, anxiety.  PHQ9 SCORE 0, GAD7 SCORE 0. Screening results negative for tested conditions. Continue to monitor.  Diabetes mellitus screening -     Hemoglobin A1c  Lipid screening -     Lipid panel  Encounter for hepatitis C screening test for low risk patient -     Hepatitis C antibody   This plan was discussed with the patient and questions were answered. There were no further concerns.  Follow up as indicated, or sooner should any new problems arise, if conditions  worsen, or if they are otherwise concerned.   See patient instructions for additional information.  Tahara Ruffini Howell Pringle, MD  Family Medicine      No future appointments.

## 2023-07-20 NOTE — Assessment & Plan Note (Signed)
Chronic. Suspect from active lifestyle. Continue to monitor.

## 2023-07-20 NOTE — Assessment & Plan Note (Signed)
H/o kidney stones. She was noted to have 2+ RBC on last u/a. She reports she was told she would have this with age. Explained possible etiologies and recommendation to repeat. Will consider with future labs. Declines urology evaluation.

## 2023-07-21 LAB — LIPID PANEL
Chol/HDL Ratio: 4.2 {ratio} (ref 0.0–4.4)
Cholesterol, Total: 222 mg/dL — ABNORMAL HIGH (ref 100–199)
HDL: 53 mg/dL (ref >39)
LDL Chol Calc (NIH): 140 mg/dL — ABNORMAL HIGH (ref 0–99)
Triglycerides: 165 mg/dL — ABNORMAL HIGH (ref 0–149)
VLDL Cholesterol Cal: 29 mg/dL (ref 5–40)

## 2023-07-21 LAB — HEMOGLOBIN A1C
Est. average glucose Bld gHb Est-mCnc: 123 mg/dL
Hgb A1c MFr Bld: 5.9 % — ABNORMAL HIGH (ref 4.8–5.6)

## 2023-07-21 LAB — HEPATITIS C ANTIBODY: Hep C Virus Ab: NONREACTIVE

## 2023-07-27 ENCOUNTER — Other Ambulatory Visit: Payer: Self-pay | Admitting: Pediatrics

## 2023-07-27 DIAGNOSIS — E785 Hyperlipidemia, unspecified: Secondary | ICD-10-CM

## 2023-07-27 MED ORDER — ATORVASTATIN CALCIUM 10 MG PO TABS: 10.0000 mg | ORAL_TABLET | Freq: Every day | ORAL | 2 refills | Status: DC

## 2023-07-27 NOTE — Progress Notes (Signed)
 Lipitor 10mg  sent to pharmacy.  Montee Tallman Howell Pringle, MD

## 2023-08-02 ENCOUNTER — Encounter: Payer: Self-pay | Admitting: Pediatrics

## 2023-08-02 ENCOUNTER — Ambulatory Visit: Payer: BC Managed Care – PPO | Admitting: Pediatrics

## 2023-08-02 VITALS — BP 144/77 | HR 65 | Temp 97.6°F | Resp 15 | Wt 175.2 lb

## 2023-08-02 DIAGNOSIS — L719 Rosacea, unspecified: Secondary | ICD-10-CM

## 2023-08-02 DIAGNOSIS — E663 Overweight: Secondary | ICD-10-CM

## 2023-08-02 DIAGNOSIS — Z1330 Encounter for screening examination for mental health and behavioral disorders, unspecified: Secondary | ICD-10-CM | POA: Diagnosis not present

## 2023-08-02 DIAGNOSIS — R3121 Asymptomatic microscopic hematuria: Secondary | ICD-10-CM

## 2023-08-02 DIAGNOSIS — I10 Essential (primary) hypertension: Secondary | ICD-10-CM | POA: Diagnosis not present

## 2023-08-02 DIAGNOSIS — R7303 Prediabetes: Secondary | ICD-10-CM | POA: Insufficient documentation

## 2023-08-02 DIAGNOSIS — E785 Hyperlipidemia, unspecified: Secondary | ICD-10-CM | POA: Insufficient documentation

## 2023-08-02 MED ORDER — METRONIDAZOLE 1 % EX GEL: Freq: Every day | CUTANEOUS | 0 refills | Status: AC

## 2023-08-02 NOTE — Assessment & Plan Note (Addendum)
 Currently on atorvastatin 10mg  daily. Discussed the importance of medication adherence and regular monitoring. Repeat levels in 3 months. -Continue atorvastatin 10mg  daily.

## 2023-08-02 NOTE — Assessment & Plan Note (Addendum)
 Elevated blood pressure readings in clinic, has not yet started checking at home. Discussed the importance of consistent home monitoring and the potential need for medication if readings remain consistently above 140/90. Exam reassuring. Low suspicion for alternative cause (sleep apnea) but can revisit at follow up. Reviewed DASH diet. -Continue home blood pressure monitoring, aiming for 5 morning and 5 evening readings over the next 2 weeks. -Consider medication if readings remain consistently above 140/90.

## 2023-08-02 NOTE — Progress Notes (Signed)
 Office Visit  BP (!) 144/77 (BP Location: Left Arm, Patient Position: Sitting, Cuff Size: Normal)   Pulse 65   Temp 97.6 F (36.4 C) (Oral)   Resp 15   Wt 175 lb 3.2 oz (79.5 kg)   SpO2 97%   BMI 27.44 kg/m    Subjective:    Patient ID: [name], female    DOB: [birth year], 68 y.o.   MRN: [id number]  HPI: [name] is a 68 y.o. female  Chief Complaint  Patient presents with   Follow-up    Review labs     Discussed the use of AI scribe software for clinical note transcription with the patient, who gave verbal consent to proceed.  History of Present Illness   The patient, with a history of prediabetes and elevated cholesterol, presents with concerns about recent lab results and newly elevated blood pressure readings. She has initiated a low-salt diet and started atorvastatin  for cholesterol management. She expresses understanding of the potential implications of her elevated A1c and blood pressure, including the risk of dementia and cardiovascular disease.  The patient has been monitoring her blood pressure at home and is aware of the risk of hypotension with aggressive management. She expresses a desire to manage her blood pressure through lifestyle modifications, primarily weight loss, before considering antihypertensive medications. She reports a recent weight of 175 lbs and expresses a goal to lose at least 40 lbs.  The patient acknowledges a strong family history of cardiovascular disease, including a recent stroke in a relative, and expresses concern about her own risk. She is also aware of the potential for calcified vessels near the heart and the implications of such a finding.  In addition to these concerns, the patient reports a persistent redness on her nose, which has been present for a couple of months. She has previously been treated with an antibiotic for this issue, with temporary resolution of symptoms. The patient denies any history of skin cancer.  The  patient denies any symptoms suggestive of sleep apnea, such as snoring or daytime sleepiness. She reports good sleep quality and denies any interruptions in her sleep. She expresses willingness to consider a sleep study if her blood pressure remains elevated despite weight loss.     Relevant past medical, surgical, family and social history reviewed and updated as indicated. Interim medical history since our last visit reviewed. Allergies and medications reviewed and updated.  ROS per HPI unless specifically indicated above     Objective:    BP (!) 144/77 (BP Location: Left Arm, Patient Position: Sitting, Cuff Size: Normal)   Pulse 65   Temp 97.6 F (36.4 C) (Oral)   Resp 15   Wt 175 lb 3.2 oz (79.5 kg)   SpO2 97%   BMI 27.44 kg/m   Wt Readings from Last 3 Encounters:  08/02/23 175 lb 3.2 oz (79.5 kg)  07/20/23 176 lb 14.4 oz (80.2 kg)  05/22/23 177 lb 6.4 oz (80.5 kg)     Physical Exam Constitutional:      Appearance: Normal appearance.  HENT:     Head: Normocephalic and atraumatic.     Nose: Nose normal.  Eyes:     Pupils: Pupils are equal, round, and reactive to light.  Cardiovascular:     Rate and Rhythm: Normal rate and regular rhythm.     Pulses: Normal pulses.     Heart sounds: Normal heart sounds.  Pulmonary:     Effort: Pulmonary effort is normal.  Breath sounds: Normal breath sounds.  Musculoskeletal:        General: Normal range of motion.     Cervical back: Normal range of motion.  Skin:    General: Skin is warm and dry.     Capillary Refill: Capillary refill takes less than 2 seconds.     Findings: Rash present.     Comments: Erythematous rash around nose, 2 small raised papules  Neurological:     General: No focal deficit present.     Mental Status: She is alert. Mental status is at baseline.  Psychiatric:        Mood and Affect: Mood normal.        Behavior: Behavior normal.         08/02/2023    1:10 PM 07/20/2023    1:05 PM 05/22/2023    11:33 AM 05/22/2023   11:32 AM 12/15/2021    2:13 PM  Depression screen PHQ 2/9  Decreased Interest 0 0 0 0 0  Down, Depressed, Hopeless 0 0 0 0 0  PHQ - 2 Score 0 0 0 0 0  Altered sleeping 0 0 0  0  Tired, decreased energy 0 0 0  0  Change in appetite 0 0 0  0  Feeling bad or failure about yourself  0 0 0  0  Trouble concentrating 0 0 0  0  Moving slowly or fidgety/restless 0 0 0  0  Suicidal thoughts 0 0 0  0  PHQ-9 Score 0 0 0  0  Difficult doing work/chores Not difficult at all Not difficult at all Not difficult at all         08/02/2023    1:10 PM 05/22/2023   11:33 AM 12/15/2021    2:13 PM 11/28/2021    9:00 AM  GAD 7 : Generalized Anxiety Score  Nervous, Anxious, on Edge 0 0 0 0  Control/stop worrying 0 0 0 0  Worry too much - different things 0 0 0 0  Trouble relaxing 0 0 0 0  Restless 0 0 0 0  Easily annoyed or irritable 0 0 0 0  Afraid - awful might happen 1 0 0 0  Total GAD 7 Score 1 0 0 0  Anxiety Difficulty Not difficult at all Not difficult at all Not difficult at all        Assessment & Plan:  Assessment & Plan   Primary hypertension Assessment & Plan: Elevated blood pressure readings in clinic, has not yet started checking at home. Discussed the importance of consistent home monitoring and the potential need for medication if readings remain consistently above 140/90. Exam reassuring. Low suspicion for alternative cause (sleep apnea) but can revisit at follow up. Reviewed DASH diet. -Continue home blood pressure monitoring, aiming for 5 morning and 5 evening readings over the next 2 weeks. -Consider medication if readings remain consistently above 140/90.  Overweight (BMI 25.0-29.9) Prediabetes Assessment & Plan: A1c in the prediabetic range. Discussed the importance of lifestyle modifications and can consider metformin if A1c continues to rise and/or weight loss doesn't normalize value. -Continue lifestyle modifications, including diet and weight  loss.  Rosacea Assessment & Plan: Redness and irritation on the nose, likely rosacea. Discussed treatment options. Encouraged sunscreen. -Prescribe metronidazole  gel, apply once daily  Orders: -     metroNIDAZOLE ; Apply topically daily for 14 days. If rash recurs can use 14 days at a time.  Dispense: 45 g; Refill: 0  Hyperlipidemia, unspecified hyperlipidemia type  Assessment & Plan: Currently on atorvastatin  10mg  daily. Discussed the importance of medication adherence and regular monitoring. Repeat levels in 3 months. -Continue atorvastatin  10mg  daily.  Encounter for behavioral health screening As part of their intake evaluation, the patient was screened for depression, anxiety.  PHQ9 SCORE 0, GAD7 SCORE 1. Screening results negative for tested conditions. Continue to monitor.  Follow up plan: Return in about 3 months (around 10/31/2023) for HTN.  Bunnie Lederman SHAUNNA NETT, MD

## 2023-08-02 NOTE — Assessment & Plan Note (Signed)
 Redness and irritation on the nose, likely rosacea. Discussed treatment options. Encouraged sunscreen. -Prescribe metronidazole gel, apply once daily

## 2023-08-02 NOTE — Assessment & Plan Note (Addendum)
 A1c in the prediabetic range. Discussed the importance of lifestyle modifications and can consider metformin if A1c continues to rise and/or weight loss doesn't normalize value. -Continue lifestyle modifications, including diet and weight loss.

## 2023-08-02 NOTE — Patient Instructions (Signed)
 Message me blood pressure readings

## 2023-09-17 LAB — HM MAMMOGRAPHY

## 2023-09-26 ENCOUNTER — Other Ambulatory Visit: Payer: Self-pay | Admitting: Pediatrics

## 2023-09-26 DIAGNOSIS — E785 Hyperlipidemia, unspecified: Secondary | ICD-10-CM

## 2023-09-27 NOTE — Telephone Encounter (Signed)
 Labs in date.  Requested Prescriptions  Pending Prescriptions Disp Refills   atorvastatin (LIPITOR) 10 MG tablet [Pharmacy Med Name: ATORVASTATIN 10 MG TABLET] 90 tablet 1    Sig: Take 1 tablet (10 mg total) by mouth daily.     Cardiovascular:  Antilipid - Statins Failed - 09/27/2023 12:43 PM      Failed - Lipid Panel in normal range within the last 12 months    Cholesterol, Total  Date Value Ref Range Status  07/20/2023 222 (H) 100 - 199 mg/dL Final   LDL Chol Calc (NIH)  Date Value Ref Range Status  07/20/2023 140 (H) 0 - 99 mg/dL Final   HDL  Date Value Ref Range Status  07/20/2023 53 >39 mg/dL Final   Triglycerides  Date Value Ref Range Status  07/20/2023 165 (H) 0 - 149 mg/dL Final         Passed - Patient is not pregnant      Passed - Valid encounter within last 12 months    Recent Outpatient Visits           1 month ago Primary hypertension   [place] [facility] Jackolyn Confer, MD   2 months ago Annual physical exam   [place] [facility] Jackolyn Confer, MD   4 months ago RUQ pain   [place] [facility] Jackolyn Confer, MD   1 year ago Overweight (BMI 25.0-29.9)   [place] [facility] Mecum, Oswaldo Conroy, PA-C   1 year ago Posterior left knee pain   [place] [facility] Mecum, Oswaldo Conroy, PA-C       Future Appointments             In 1 month Evelene Croon, Atilano Median, MD [place] [facility]

## 2023-10-02 ENCOUNTER — Encounter: Payer: Self-pay | Admitting: Nurse Practitioner

## 2023-10-02 ENCOUNTER — Ambulatory Visit: Payer: Self-pay | Admitting: Pediatrics

## 2023-10-02 ENCOUNTER — Ambulatory Visit: Admitting: Nurse Practitioner

## 2023-10-02 VITALS — BP 126/74 | HR 64 | Temp 97.9°F | Resp 18 | Ht 67.01 in | Wt 175.4 lb

## 2023-10-02 DIAGNOSIS — B0239 Other herpes zoster eye disease: Secondary | ICD-10-CM | POA: Diagnosis not present

## 2023-10-02 MED ORDER — VALACYCLOVIR HCL 1 G PO TABS: 1000.0000 mg | ORAL_TABLET | Freq: Three times a day (TID) | ORAL | 0 refills | Status: DC

## 2023-10-02 NOTE — Telephone Encounter (Signed)
 Copied from CRM [id number]. Topic: Clinical - Red Word Triage >> Oct 02, 2023  9:00 AM Patsy Lager T wrote: [facility] that prompted transfer to Nurse Triage: patient called stated the pain in her eye is getting worse and she can't wait until her appt at 11:20 today. Reason for Disposition  Shingles rash on the eyelid or tip of the nose  Answer Assessment - Initial Assessment Questions 1. APPEARANCE of RASH: "Describe the rash."      I have Shingles, I think around my eye.   Started Friday.   It felt like an eye lash in my eye. 2. LOCATION: "Where is the rash located?"      RASH IS on my forehead and is worse.   My eye is swollen and puffy.    3. ONSET: "When did the rash start?"      Friday 4. ITCHING: "Does the rash itch?" If Yes, ask: "How bad is the itch?"  (Scale 1-10; or mild, moderate, severe)     Itching 5. PAIN: "Does the rash hurt?" If Yes, ask: "How bad is the pain?"  (Scale 0-10; or none, mild, moderate, severe)    - NONE (0): no pain    - MILD (1-3): doesn't interfere with normal activities     - MODERATE (4-7): interferes with normal activities or awakens from sleep     - SEVERE (8-10): excruciating pain, unable to do any normal activities     Yes 6. OTHER SYMPTOMS: "Do you have any other symptoms?" (e.g., fever)     No drainage.   Very watery my eye. I'm cleaning my eye with sterile pads. 7. PREGNANCY: "Is there any chance you are pregnant?" "When was your last menstrual period?"     N/A  Protocols used: Shingles (Zoster)-A-AH  Chief Complaint: Has an appt this morning at 11:20 for possible shingles around left eye and on forehead.    Has a busy day planned and wanting to know if she can come in rigfht now to be seen.   Checked schedule but nothing available sooner.    Instructed her to keep the 11:20 appt this morning which she was agreeable to doing. Symptoms: Rash/shingles looking around left eye and on forehead. Frequency: Since Friday Pertinent Negatives: Patient denies  fever or drainage just eye is watery Disposition: [] ED /[] Urgent Care (no appt availability in office) / [x] Appointment(In office/virtual)/ []  [place] [facility]/ [] Home Care/ [] Refused Recommended Disposition /[] [place] [facility]/ []  Follow-up with PCP Additional Notes: Pt already has an appt for this morning at 11:20.

## 2023-10-02 NOTE — Progress Notes (Signed)
 BP 126/74 (BP Location: Left Arm, Patient Position: Sitting, Cuff Size: Normal)   Pulse 64   Temp 97.9 F (36.6 C) (Oral)   Resp 18   Ht 5' 7.01" (1.702 m)   Wt 175 lb 6.4 oz (79.6 kg)   SpO2 99%   BMI 27.46 kg/m    Subjective:    Patient ID: [name], female    DOB: 01/04/1956, 68 y.o.   MRN: [id number]  HPI: [name] is a 68 y.o. female  Chief Complaint  Patient presents with   Eye Problem    Started over weekend. Has a rash and pain and burning into the eye   EYE PAIN On Friday felt like eyelash in eye.  Starting yesterday with rash to forehead area.  Has been having intermittent headaches. Duration:  days Involved eye:  left Onset: sudden Foreign body sensation:yes Visual impairment: no Eye redness: no Discharge: no Crusting or matting of eyelids: no Swelling: no Photophobia: no Itching: no Tearing: yes Headache: yes Floaters: no URI symptoms: no Contact lens use: no Close contacts with similar problems: no Eye trauma: no Aggravating factors:  Alleviating factors:  Status: fluctuating Treatments attempted:   Relevant past medical, surgical, family and social history reviewed and updated as indicated. Interim medical history since our last visit reviewed. Allergies and medications reviewed and updated.  Review of Systems  Constitutional:  Negative for activity change, appetite change, fatigue and fever.  Eyes:  Positive for itching. Negative for photophobia, pain, discharge, redness and visual disturbance.  Respiratory:  Negative for cough, chest tightness, shortness of breath and wheezing.   Cardiovascular:  Negative for chest pain, palpitations and leg swelling.  Skin:  Positive for rash.  Neurological: Negative.   Psychiatric/Behavioral: Negative.      Per HPI unless specifically indicated above     Objective:    BP 126/74 (BP Location: Left Arm, Patient Position: Sitting, Cuff Size: Normal)   Pulse 64   Temp 97.9 F (36.6 C)  (Oral)   Resp 18   Ht 5' 7.01" (1.702 m)   Wt 175 lb 6.4 oz (79.6 kg)   SpO2 99%   BMI 27.46 kg/m   Wt Readings from Last 3 Encounters:  10/02/23 175 lb 6.4 oz (79.6 kg)  08/02/23 175 lb 3.2 oz (79.5 kg)  07/20/23 176 lb 14.4 oz (80.2 kg)    Physical Exam Vitals and nursing note reviewed.  Constitutional:      General: She is awake. She is not in acute distress.    Appearance: She is well-developed and well-groomed. She is not ill-appearing or toxic-appearing.  HENT:     Head: Normocephalic.     Right Ear: Hearing and external ear normal.     Left Ear: Hearing and external ear normal.  Eyes:     General: Lids are normal.        Right eye: No discharge.        Left eye: No discharge.     Conjunctiva/sclera:     Right eye: Right conjunctiva is not injected.     Left eye: Left conjunctiva is injected. No hemorrhage.    Pupils: Pupils are equal, round, and reactive to light.     Visual Fields: Right eye visual fields normal and left eye visual fields normal.     Comments: Rash along left eyelid and left forehead.  Neck:     Thyroid: No thyromegaly.     Vascular: No carotid bruit.  Cardiovascular:  Rate and Rhythm: Normal rate and regular rhythm.     Heart sounds: Normal heart sounds. No murmur heard.    No gallop.  Pulmonary:     Effort: Pulmonary effort is normal. No accessory muscle usage or respiratory distress.     Breath sounds: Normal breath sounds.  Abdominal:     General: Bowel sounds are normal. There is no distension.     Palpations: Abdomen is soft.     Tenderness: There is no abdominal tenderness.  Musculoskeletal:     Cervical back: Normal range of motion and neck supple.     Right lower leg: No edema.     Left lower leg: No edema.  Lymphadenopathy:     Cervical: No cervical adenopathy.  Skin:    General: Skin is warm and dry.     Findings: Rash present. Rash is vesicular.     Comments: Vesicular rash present along ophthalmic nerve (V1) and frontal  nerve left side.  Red base with blisters.  Some mild swelling to left eyelid.  There is rash along upper left eyelid.  Neurological:     Mental Status: She is alert and oriented to person, place, and time.     Deep Tendon Reflexes: Reflexes are normal and symmetric.     Reflex Scores:      Brachioradialis reflexes are 2+ on the right side and 2+ on the left side.      Patellar reflexes are 2+ on the right side and 2+ on the left side. Psychiatric:        Attention and Perception: Attention normal.        Mood and Affect: Mood normal.        Speech: Speech normal.        Behavior: Behavior normal. Behavior is cooperative.        Thought Content: Thought content normal.     Results for orders placed or performed in visit on 07/20/23  CBC   Collection Time: 07/20/23  2:08 PM  Result Value Ref Range   WBC 5.2 3.4 - 10.8 x10E3/uL   RBC 4.81 3.77 - 5.28 x10E6/uL   Hemoglobin 13.6 11.1 - 15.9 g/dL   Hematocrit 16.1 09.6 - 46.6 %   MCV 85 79 - 97 fL   MCH 28.3 26.6 - 33.0 pg   MCHC 33.2 31.5 - 35.7 g/dL   RDW 04.5 40.9 - 81.1 %   Platelets 233 150 - 450 x10E3/uL  Lipid Profile   Collection Time: 07/20/23  2:10 PM  Result Value Ref Range   Cholesterol, Total 222 (H) 100 - 199 mg/dL   Triglycerides 914 (H) 0 - 149 mg/dL   HDL 53 >78 mg/dL   VLDL Cholesterol Cal 29 5 - 40 mg/dL   LDL Chol Calc (NIH) 295 (H) 0 - 99 mg/dL   Chol/HDL Ratio 4.2 0.0 - 4.4 ratio  HgB A1c   Collection Time: 07/20/23  2:10 PM  Result Value Ref Range   Hgb A1c MFr Bld 5.9 (H) 4.8 - 5.6 %   Est. average glucose Bld gHb Est-mCnc 123 mg/dL  Hepatitis C Antibody   Collection Time: 07/20/23  2:10 PM  Result Value Ref Range   Hep C Virus Ab Non Reactive Non Reactive      Assessment & Plan:   Problem List Items Addressed This Visit       Nervous and Auditory   Shingles of eyelid - Primary   Acute with rash presenting yesterday to left  forehead and eyelid.  Will place urgent referral to ophthalmology for  eye exam.  Start Valtrex 1000 MG TID.  Plan on return in 2 days for recheck.  Educated patient on this plan of care.      Relevant Medications   valACYclovir (VALTREX) 1000 MG tablet   Other Relevant Orders   Ambulatory referral to Ophthalmology     Follow up plan: Return in about 2 days (around 10/04/2023) for Shingles.

## 2023-10-02 NOTE — Patient Instructions (Signed)
Nerve Pain After Shingles Postherpetic neuralgia (PHN) is nerve pain you may get after you have shingles. Shingles is an infection that causes a painful rash and blisters. It's caused by the same germ that causes chickenpox. PHN affects the spot on your body where you had the shingles rash. It can last for 3 months after your rash has gone away. What are the causes? PHN may be caused by damage to your nerves. This damage may come from swelling from the shingles infection. What increases the risk? You may be more likely to get PHN if: You're older than 68 years of age. You have severe pain before your rash starts. You have a very bad rash. You have shingles in and around your eye. Your body defense system (immune system) is weak. What are the signs or symptoms? The main symptom of PHN is pain. The pain may: Be stabbing, burning, or shooting. Feel like an electric shock. Come and go, or it may be there all the time. Get worse if: Something touches your skin. The temperature goes up or down. You may also have itching. How is this diagnosed? PHN may be diagnosed based on: Your symptoms. Whether you've had shingles before. How is this treated? There's no cure, but treatment can help with the pain. Normal pain medicines may not help. You may need to work with an expert in treating pain to find what works best for you. Treatment may include: Anti-seizure medicines. Antidepressants. Strong pain medicines. A numbing patch worn on the skin. Shots of: Numbing medicines. Medicines to treat inflammation. Botulinum toxin. This can block pain signals and stop you from feeling pain. Follow these instructions at home: Medicines Take over-the-counter and prescription medicines only as told by your health care provider. Ask your provider if the medicine prescribed to you: Requires you to avoid driving or using machinery. Can cause trouble pooping or constipation. You may need to take these  actions to prevent or treat trouble pooping: Drink enough fluid to keep your pee (urine) pale yellow. Take over-the-counter or prescription medicines. Eat foods that are high in fiber, such as beans, whole grains, and fresh fruits and vegetables. Limit foods that are high in fat and processed sugars, such as fried or sweet foods. Managing pain  If told, put ice on the painful area. Put ice in a plastic bag. Place a towel between your skin and the bag. Leave the ice on for 20 minutes, 2-3 times a day. If your skin turns bright red, remove the ice right away to prevent skin damage. The risk of damage is higher if you can't feel pain, heat, or cold. Cover sensitive spots with a bandage, or dressing, to stop clothes from rubbing. Wear loose clothes. General instructions It may take a long time for you to get better. Work closely with your provider. Think about talking with a mental health care provider. They can help you find ways to cope with feeling overwhelmed or hopeless. Have a good support system at home. Think about joining a pain support group. How is this prevented? Vaccines are the best way to prevent shingles and PHN. You should get the vaccine shot for shingles once you're older than 68 years of age. Talk with your provider about getting the shot. Contact a health care provider if: Your medicine isn't helping. You can't manage your pain at home. You feel sad or depressed. Get help right away if: You have thoughts about hurting yourself or others. Get help right away if  you feel like you may hurt yourself or others, or have thoughts about taking your own life. Go to your nearest emergency room or: Call 911. Call the National Suicide Prevention Lifeline at (414)038-7354 or 988. This is open 24 hours a day. Text the Crisis Text Line at (340) 829-8385. This information is not intended to replace advice given to you by your health care provider. Make sure you discuss any questions you have  with your health care provider. Document Revised: 10/12/2022 Document Reviewed: 10/12/2022 Elsevier Patient Education  2024 [employer].

## 2023-10-02 NOTE — Assessment & Plan Note (Addendum)
 Acute with rash presenting yesterday to left forehead and eyelid.  Will place urgent referral to ophthalmology for eye exam.  Start Valtrex 1000 MG TID.  Plan on return in 2 days for recheck.  Educated patient on this plan of care.

## 2023-10-04 ENCOUNTER — Telehealth: Payer: Self-pay

## 2023-10-04 ENCOUNTER — Other Ambulatory Visit: Payer: Self-pay | Admitting: Nurse Practitioner

## 2023-10-04 ENCOUNTER — Encounter: Payer: Self-pay | Admitting: Nurse Practitioner

## 2023-10-04 ENCOUNTER — Ambulatory Visit: Admitting: Nurse Practitioner

## 2023-10-04 ENCOUNTER — Ambulatory Visit: Payer: Self-pay | Admitting: Pediatrics

## 2023-10-04 VITALS — BP 122/73 | HR 79 | Ht 67.0 in | Wt 175.0 lb

## 2023-10-04 DIAGNOSIS — B0239 Other herpes zoster eye disease: Secondary | ICD-10-CM | POA: Diagnosis not present

## 2023-10-04 MED ORDER — TRIAMCINOLONE ACETONIDE 0.025 % EX CREA: 1.0000 | TOPICAL_CREAM | Freq: Two times a day (BID) | CUTANEOUS | 1 refills | Status: AC

## 2023-10-04 MED ORDER — METHYLPREDNISOLONE 4 MG PO TBPK: ORAL_TABLET | ORAL | 0 refills | Status: DC

## 2023-10-04 NOTE — Telephone Encounter (Unsigned)
   Copied from CRM [id number]. Topic: Clinical - Medication Question >> Oct 04, 2023 12:34 PM Runell Gess P wrote: Reason for CRM: pt was in today and seen NP and was to be prescribed medication but patient declined. Now she would like to be prescribed the medication offered.    CB#  [phone]

## 2023-10-04 NOTE — Assessment & Plan Note (Signed)
 Acute with rash presenting yesterday to left forehead and eyelid. Saw ophthalmology.  Continue Valtrex 1000 MG TID.  Sent in Triamcinolone cream 0.025% to apply if itching or irritation.  May benefit Prednisone to help with inflammation, but will hold off as shingles presented after a steroid injection in knee. Plan on return on Monday for recheck.  Educated patient on this plan of care.

## 2023-10-04 NOTE — Telephone Encounter (Signed)
 Copied from CRM [id number]. Topic: Clinical - Medical Advice >> Oct 04, 2023  1:54 PM Alessandra Bevels wrote: Reason for CRM: Patient is calling to report that she saw Jolene this morning and declined the prednisone.  Patient is calling to report that she would like the Prednisone Taper to be sent to the below pharmacy  [facility] - [place], [place] - [address] [place] [address] Phone: [phone] Fax: [phone] Hours: Not open 24 hours

## 2023-10-04 NOTE — Progress Notes (Signed)
 BP 122/73 (BP Location: Left Arm, Patient Position: Sitting, Cuff Size: Large)   Pulse 79   Ht 5\' 7"  (1.702 m)   Wt 175 lb (79.4 kg)   SpO2 97%   BMI 27.41 kg/m    Subjective:    Patient ID: [name], female    DOB: [birth year], 68 y.o.   MRN: [id number]  HPI: Clementine Soulliere is a 68 y.o. female  Chief Complaint  Patient presents with   Herpes Zoster    Face, left side eye   SHINGLES Diagnosed with shingles on 10/02/23, to left forehead and eyelid.  Currently being treated with Valtrex.  She did see ophthalmology on the 11th and had an overall reassuring eye exam with no drops needed.  Hard to see music as it can get blurry to left eye.  Prior to shingles she had received a steroid injection in her right knee. Duration: days Location:  left forehead and eyelid Painful:  no Paresthesia:  no Hyperesthesia: no Itching:  yes Burning:  yes Oozing:  no Blisters:  yes Fevers:  no History of the same:  no Alleviating factors: Valtrex Status: worse Treatments attempted: Valtrex  Relevant past medical, surgical, family and social history reviewed and updated as indicated. Interim medical history since our last visit reviewed. Allergies and medications reviewed and updated.  Review of Systems  Constitutional:  Negative for activity change, appetite change, fatigue and fever.  Eyes:  Positive for itching. Negative for photophobia, pain, discharge, redness and visual disturbance.  Respiratory:  Negative for cough, chest tightness, shortness of breath and wheezing.   Cardiovascular:  Negative for chest pain, palpitations and leg swelling.  Skin:  Positive for rash.  Neurological: Negative.   Psychiatric/Behavioral: Negative.      Per HPI unless specifically indicated above     Objective:    BP 122/73 (BP Location: Left Arm, Patient Position: Sitting, Cuff Size: Large)   Pulse 79   Ht 5\' 7"  (1.702 m)   Wt 175 lb (79.4 kg)   SpO2 97%   BMI 27.41 kg/m   Wt Readings from  Last 3 Encounters:  10/04/23 175 lb (79.4 kg)  10/02/23 175 lb 6.4 oz (79.6 kg)  08/02/23 175 lb 3.2 oz (79.5 kg)    Physical Exam Vitals and nursing note reviewed.  Constitutional:      General: She is awake. She is not in acute distress.    Appearance: She is well-developed and well-groomed. She is not ill-appearing or toxic-appearing.  HENT:     Head: Normocephalic.     Right Ear: Hearing and external ear normal.     Left Ear: Hearing and external ear normal.  Eyes:     General: Lids are normal.        Right eye: No discharge.        Left eye: No discharge.     Conjunctiva/sclera:     Right eye: Right conjunctiva is not injected.     Left eye: Left conjunctiva is injected. No hemorrhage.    Pupils: Pupils are equal, round, and reactive to light.     Visual Fields: Right eye visual fields normal and left eye visual fields normal.     Comments: Rash along left eyelid and left forehead.  Eye with swelling around outside.  Neck:     Thyroid: No thyromegaly.     Vascular: No carotid bruit.  Cardiovascular:     Rate and Rhythm: Normal rate and regular rhythm.  Heart sounds: Normal heart sounds. No murmur heard.    No gallop.  Pulmonary:     Effort: Pulmonary effort is normal. No accessory muscle usage or respiratory distress.     Breath sounds: Normal breath sounds.  Abdominal:     General: Bowel sounds are normal. There is no distension.     Palpations: Abdomen is soft.     Tenderness: There is no abdominal tenderness.  Musculoskeletal:     Cervical back: Normal range of motion and neck supple.     Right lower leg: No edema.     Left lower leg: No edema.  Lymphadenopathy:     Cervical: No cervical adenopathy.  Skin:    General: Skin is warm and dry.     Findings: Rash present. Rash is vesicular.     Comments: Vesicular rash present along ophthalmic nerve (V1) and frontal nerve left side.  Red base with blisters.  Some swelling to left eyelid, more prominent on this  exam.  Rash spread slightly since previous check.  There is crusting to some areas.  Neurological:     Mental Status: She is alert and oriented to person, place, and time.     Deep Tendon Reflexes: Reflexes are normal and symmetric.     Reflex Scores:      Brachioradialis reflexes are 2+ on the right side and 2+ on the left side.      Patellar reflexes are 2+ on the right side and 2+ on the left side. Psychiatric:        Attention and Perception: Attention normal.        Mood and Affect: Mood normal.        Speech: Speech normal.        Behavior: Behavior normal. Behavior is cooperative.        Thought Content: Thought content normal.     Results for orders placed or performed in visit on 07/20/23  CBC   Collection Time: 07/20/23  2:08 PM  Result Value Ref Range   WBC 5.2 3.4 - 10.8 x10E3/uL   RBC 4.81 3.77 - 5.28 x10E6/uL   Hemoglobin 13.6 11.1 - 15.9 g/dL   Hematocrit 16.1 09.6 - 46.6 %   MCV 85 79 - 97 fL   MCH 28.3 26.6 - 33.0 pg   MCHC 33.2 31.5 - 35.7 g/dL   RDW 04.5 40.9 - 81.1 %   Platelets 233 150 - 450 x10E3/uL  Lipid Profile   Collection Time: 07/20/23  2:10 PM  Result Value Ref Range   Cholesterol, Total 222 (H) 100 - 199 mg/dL   Triglycerides 914 (H) 0 - 149 mg/dL   HDL 53 >78 mg/dL   VLDL Cholesterol Cal 29 5 - 40 mg/dL   LDL Chol Calc (NIH) 295 (H) 0 - 99 mg/dL   Chol/HDL Ratio 4.2 0.0 - 4.4 ratio  HgB A1c   Collection Time: 07/20/23  2:10 PM  Result Value Ref Range   Hgb A1c MFr Bld 5.9 (H) 4.8 - 5.6 %   Est. average glucose Bld gHb Est-mCnc 123 mg/dL  Hepatitis C Antibody   Collection Time: 07/20/23  2:10 PM  Result Value Ref Range   Hep C Virus Ab Non Reactive Non Reactive      Assessment & Plan:   Problem List Items Addressed This Visit       Nervous and Auditory   Shingles of eyelid - Primary   Acute with rash presenting yesterday to left forehead and  eyelid. Saw ophthalmology.  Continue Valtrex 1000 MG TID.  Sent in Triamcinolone cream  0.025% to apply if itching or irritation.  May benefit Prednisone to help with inflammation, but will hold off as shingles presented after a steroid injection in knee. Plan on return on Monday for recheck.  Educated patient on this plan of care.        Follow up plan: Return in about 4 days (around 10/08/2023) for Shingles.

## 2023-10-04 NOTE — Telephone Encounter (Signed)
 Copied from CRM [id number]. Topic: Clinical - Medication Refill >> Oct 04, 2023  3:45 PM Ivette P wrote: Most Recent Primary Care Visit:  Provider: Aura Dials T  Department: [facility]  Visit Type: OFFICE VISIT  Date: 10/04/2023  Medication: Prednisone  Has the patient contacted their pharmacy? Yes (Agent: If no, request that the patient contact the pharmacy for the refill. If patient does not wish to contact the pharmacy document the reason why and proceed with request.) (Agent: If yes, when and what did the pharmacy advise?)  Is this the correct pharmacy for this prescription? Yes If no, delete pharmacy and type the correct one.  This is the patient's preferred pharmacy:  [facility] - [place], [place] - [address] [place] [address] Phone: [phone] Fax: [phone]   Has the prescription been filled recently? No  Is the patient out of the medication? Yes  Has the patient been seen for an appointment in the last year OR does the patient have an upcoming appointment? Yes  Can we respond through MyChart? No  Agent: Please be advised that Rx refills may take up to 3 business days. We ask that you follow-up with your pharmacy.

## 2023-10-04 NOTE — Telephone Encounter (Signed)
Sending to provider for completion.

## 2023-10-04 NOTE — Patient Instructions (Signed)
Nerve Pain After Shingles Postherpetic neuralgia (PHN) is nerve pain you may get after you have shingles. Shingles is an infection that causes a painful rash and blisters. It's caused by the same germ that causes chickenpox. PHN affects the spot on your body where you had the shingles rash. It can last for 3 months after your rash has gone away. What are the causes? PHN may be caused by damage to your nerves. This damage may come from swelling from the shingles infection. What increases the risk? You may be more likely to get PHN if: You're older than 68 years of age. You have severe pain before your rash starts. You have a very bad rash. You have shingles in and around your eye. Your body defense system (immune system) is weak. What are the signs or symptoms? The main symptom of PHN is pain. The pain may: Be stabbing, burning, or shooting. Feel like an electric shock. Come and go, or it may be there all the time. Get worse if: Something touches your skin. The temperature goes up or down. You may also have itching. How is this diagnosed? PHN may be diagnosed based on: Your symptoms. Whether you've had shingles before. How is this treated? There's no cure, but treatment can help with the pain. Normal pain medicines may not help. You may need to work with an expert in treating pain to find what works best for you. Treatment may include: Anti-seizure medicines. Antidepressants. Strong pain medicines. A numbing patch worn on the skin. Shots of: Numbing medicines. Medicines to treat inflammation. Botulinum toxin. This can block pain signals and stop you from feeling pain. Follow these instructions at home: Medicines Take over-the-counter and prescription medicines only as told by your health care provider. Ask your provider if the medicine prescribed to you: Requires you to avoid driving or using machinery. Can cause trouble pooping or constipation. You may need to take these  actions to prevent or treat trouble pooping: Drink enough fluid to keep your pee (urine) pale yellow. Take over-the-counter or prescription medicines. Eat foods that are high in fiber, such as beans, whole grains, and fresh fruits and vegetables. Limit foods that are high in fat and processed sugars, such as fried or sweet foods. Managing pain  If told, put ice on the painful area. Put ice in a plastic bag. Place a towel between your skin and the bag. Leave the ice on for 20 minutes, 2-3 times a day. If your skin turns bright red, remove the ice right away to prevent skin damage. The risk of damage is higher if you can't feel pain, heat, or cold. Cover sensitive spots with a bandage, or dressing, to stop clothes from rubbing. Wear loose clothes. General instructions It may take a long time for you to get better. Work closely with your provider. Think about talking with a mental health care provider. They can help you find ways to cope with feeling overwhelmed or hopeless. Have a good support system at home. Think about joining a pain support group. How is this prevented? Vaccines are the best way to prevent shingles and PHN. You should get the vaccine shot for shingles once you're older than 68 years of age. Talk with your provider about getting the shot. Contact a health care provider if: Your medicine isn't helping. You can't manage your pain at home. You feel sad or depressed. Get help right away if: You have thoughts about hurting yourself or others. Get help right away if  you feel like you may hurt yourself or others, or have thoughts about taking your own life. Go to your nearest emergency room or: Call 911. Call the National Suicide Prevention Lifeline at (414)038-7354 or 988. This is open 24 hours a day. Text the Crisis Text Line at (340) 829-8385. This information is not intended to replace advice given to you by your health care provider. Make sure you discuss any questions you have  with your health care provider. Document Revised: 10/12/2022 Document Reviewed: 10/12/2022 Elsevier Patient Education  2024 [employer].

## 2023-10-04 NOTE — Telephone Encounter (Signed)
 Patient called to check on prednisone. This RN advised that it was sent to pharmacy today.   Copied from CRM [id number]. Topic: Clinical - Red Word Triage >> Oct 04, 2023  5:12 PM [name] wrote: Red Word that prompted transfer to Nurse Triage: have shingles and face is swelling

## 2023-10-05 ENCOUNTER — Ambulatory Visit: Payer: Self-pay | Admitting: Pediatrics

## 2023-10-05 NOTE — Telephone Encounter (Signed)
Call could not be completed as dialed

## 2023-10-05 NOTE — Telephone Encounter (Signed)
 See my chart message

## 2023-10-05 NOTE — Telephone Encounter (Signed)
 Attempted to call patient, call could not be completed as dialed   Copied From CRM [id number]. Reason for Triage: shingles rash under eye

## 2023-10-05 NOTE — Telephone Encounter (Signed)
 Unable to reach patient after 3 attempts by E2C2 NT, routing to the provider for resolution per protocol.

## 2023-10-05 NOTE — Telephone Encounter (Signed)
 CRM # [id number] Owner: None Status: Unresolved Open  Priority: Routine Created on: 10/04/2023 05:28 PM By: Lottie Dawson   Primary Information  Source  Atkerson, [name] (Patient)   Subject  [name] (Patient)   Topic  General - Other    Communication  Reason for CRM: Patient upset thought her medication would be pushed through today.Marland Kitchen Pls contact her.. needing her medication.. prednisone.Marland Kitchen

## 2023-10-05 NOTE — Telephone Encounter (Signed)
 In review prescription was sent to pharmacy by Aura Dials, NP at [phone]. No further needs.

## 2023-10-06 NOTE — Patient Instructions (Signed)
 Be Involved in Caring For Your Health:  Taking Medications When medications are taken as directed, they can greatly improve your health. But if they are not taken as prescribed, they may not work. In some cases, not taking them correctly can be harmful. To help ensure your treatment remains effective and safe, understand your medications and how to take them. Bring your medications to each visit for review by your provider.  Your lab results, notes, and after visit summary will be available on My Chart. We strongly encourage you to use this feature. If lab results are abnormal the clinic will contact you with the appropriate steps. If the clinic does not contact you assume the results are satisfactory. You can always view your results on My Chart. If you have questions regarding your health or results, please contact the clinic during office hours. You can also ask questions on My Chart.  We at [facility] are grateful that you chose [place] to provide your care. We strive to provide evidence-based and compassionate care and are always looking for feedback. If you get a survey from the clinic please complete this so we can hear your opinions.  Nerve Pain After Shingles Postherpetic neuralgia (PHN) is nerve pain you may get after you have shingles. Shingles is an infection that causes a painful rash and blisters. It's caused by the same germ that causes chickenpox. PHN affects the spot on your body where you had the shingles rash. It can last for 3 months after your rash has gone away. What are the causes? PHN may be caused by damage to your nerves. This damage may come from swelling from the shingles infection. What increases the risk? You may be more likely to get PHN if: You're older than 68 years of age. You have severe pain before your rash starts. You have a very bad rash. You have shingles in and around your eye. Your body defense system (immune system) is weak. What are the signs  or symptoms? The main symptom of PHN is pain. The pain may: Be stabbing, burning, or shooting. Feel like an electric shock. Come and go, or it may be there all the time. Get worse if: Something touches your skin. The temperature goes up or down. You may also have itching. How is this diagnosed? PHN may be diagnosed based on: Your symptoms. Whether you've had shingles before. How is this treated? There's no cure, but treatment can help with the pain. Normal pain medicines may not help. You may need to work with an expert in treating pain to find what works best for you. Treatment may include: Anti-seizure medicines. Antidepressants. Strong pain medicines. A numbing patch worn on the skin. Shots of: Numbing medicines. Medicines to treat inflammation. Botulinum toxin. This can block pain signals and stop you from feeling pain. Follow these instructions at home: Medicines Take over-the-counter and prescription medicines only as told by your health care provider. Ask your provider if the medicine prescribed to you: Requires you to avoid driving or using machinery. Can cause trouble pooping or constipation. You may need to take these actions to prevent or treat trouble pooping: Drink enough fluid to keep your pee (urine) pale yellow. Take over-the-counter or prescription medicines. Eat foods that are high in fiber, such as beans, whole grains, and fresh fruits and vegetables. Limit foods that are high in fat and processed sugars, such as fried or sweet foods. Managing pain  If told, put ice on the painful area. Put  ice in a plastic bag. Place a towel between your skin and the bag. Leave the ice on for 20 minutes, 2-3 times a day. If your skin turns bright red, remove the ice right away to prevent skin damage. The risk of damage is higher if you can't feel pain, heat, or cold. Cover sensitive spots with a bandage, or dressing, to stop clothes from rubbing. Wear loose  clothes. General instructions It may take a long time for you to get better. Work closely with your provider. Think about talking with a mental health care provider. They can help you find ways to cope with feeling overwhelmed or hopeless. Have a good support system at home. Think about joining a pain support group. How is this prevented? Vaccines are the best way to prevent shingles and PHN. You should get the vaccine shot for shingles once you're older than 68 years of age. Talk with your provider about getting the shot. Contact a health care provider if: Your medicine isn't helping. You can't manage your pain at home. You feel sad or depressed. Get help right away if: You have thoughts about hurting yourself or others. Get help right away if you feel like you may hurt yourself or others, or have thoughts about taking your own life. Go to your nearest emergency room or: Call 911. Call the National Suicide Prevention Lifeline at 818-636-8288 or 988. This is open 24 hours a day. Text the Crisis Text Line at 561-863-9254. This information is not intended to replace advice given to you by your health care provider. Make sure you discuss any questions you have with your health care provider. Document Revised: 10/12/2022 Document Reviewed: 10/12/2022 Elsevier Patient Education  2024 [employer].

## 2023-10-08 ENCOUNTER — Encounter: Payer: Self-pay | Admitting: Nurse Practitioner

## 2023-10-08 ENCOUNTER — Ambulatory Visit: Admitting: Nurse Practitioner

## 2023-10-08 VITALS — BP 124/75 | HR 67 | Temp 98.5°F | Wt 174.8 lb

## 2023-10-08 DIAGNOSIS — B0239 Other herpes zoster eye disease: Secondary | ICD-10-CM

## 2023-10-08 NOTE — Progress Notes (Signed)
 BP 124/75   Pulse 67   Temp 98.5 F (36.9 C) (Oral)   Wt 174 lb 12.8 oz (79.3 kg)   SpO2 96%   BMI 27.38 kg/m    Subjective:    Patient ID: [name], female    DOB: [birth year], 68 y.o.   MRN: [id number]  HPI: [name] is a 68 y.o. female  Chief Complaint  Patient presents with   Herpes Zoster   SHINGLES Diagnosed with shingles on 10/02/23, to left forehead and eyelid.  Treated with Valtrex, continues this.  Saw ophthalmology on the 11th and had an overall reassuring eye exam with no drops needed.  On the 14th sent in Prednisone to assist with swelling to area.  Tolerating steroid therapy.  Her last day of steroids is tomorrow.  Is having some numbness to upper forehead area, but no pain. Duration: days Location:  left forehead and eyelid Painful:  no Paresthesia:  no Hyperesthesia: no Itching:  occasionally itchy Burning:  no Oozing:  no Blisters:  improving Fevers:  no History of the same:  no Alleviating factors: Valtrex Status: improving Treatments attempted: Valtrex  Relevant past medical, surgical, family and social history reviewed and updated as indicated. Interim medical history since our last visit reviewed. Allergies and medications reviewed and updated.  Review of Systems  Constitutional:  Negative for activity change, appetite change, fatigue and fever.  Eyes:  Negative for photophobia, pain, discharge, redness, itching and visual disturbance.  Respiratory:  Negative for cough, chest tightness, shortness of breath and wheezing.   Cardiovascular:  Negative for chest pain, palpitations and leg swelling.  Skin:  Positive for rash.  Neurological: Negative.   Psychiatric/Behavioral: Negative.     Per HPI unless specifically indicated above     Objective:    BP 124/75   Pulse 67   Temp 98.5 F (36.9 C) (Oral)   Wt 174 lb 12.8 oz (79.3 kg)   SpO2 96%   BMI 27.38 kg/m   Wt Readings from Last 3 Encounters:  10/08/23 174 lb 12.8 oz (79.3 kg)   10/04/23 175 lb (79.4 kg)  10/02/23 175 lb 6.4 oz (79.6 kg)    Physical Exam Vitals and nursing note reviewed.  Constitutional:      General: She is awake. She is not in acute distress.    Appearance: She is well-developed and well-groomed. She is not ill-appearing or toxic-appearing.  HENT:     Head: Normocephalic.     Right Ear: Hearing and external ear normal.     Left Ear: Hearing and external ear normal.  Eyes:     General: Lids are normal.        Right eye: No discharge.        Left eye: No discharge.     Conjunctiva/sclera:     Right eye: Right conjunctiva is not injected.     Left eye: Left conjunctiva is not injected. No hemorrhage.    Pupils: Pupils are equal, round, and reactive to light.     Visual Fields: Right eye visual fields normal and left eye visual fields normal.     Comments: Less swelling present around left eye.  Neck:     Thyroid: No thyromegaly.     Vascular: No carotid bruit.  Cardiovascular:     Rate and Rhythm: Normal rate and regular rhythm.     Heart sounds: Normal heart sounds. No murmur heard.    No gallop.  Pulmonary:     Effort: Pulmonary  effort is normal. No accessory muscle usage or respiratory distress.     Breath sounds: Normal breath sounds.  Abdominal:     General: Bowel sounds are normal. There is no distension.     Palpations: Abdomen is soft.     Tenderness: There is no abdominal tenderness.  Musculoskeletal:     Cervical back: Normal range of motion and neck supple.     Right lower leg: No edema.     Left lower leg: No edema.  Lymphadenopathy:     Cervical: No cervical adenopathy.  Skin:    General: Skin is warm and dry.     Findings: Rash present. Rash is vesicular.     Comments: Rash crusting over, no active vesicles present.  Swelling is improving to upper left forehead and around left eye.  Neurological:     Mental Status: She is alert and oriented to person, place, and time.     Deep Tendon Reflexes: Reflexes are  normal and symmetric.     Reflex Scores:      Brachioradialis reflexes are 2+ on the right side and 2+ on the left side.      Patellar reflexes are 2+ on the right side and 2+ on the left side. Psychiatric:        Attention and Perception: Attention normal.        Mood and Affect: Mood normal.        Speech: Speech normal.        Behavior: Behavior normal. Behavior is cooperative.        Thought Content: Thought content normal.     Results for orders placed or performed in visit on 07/20/23  CBC   Collection Time: 07/20/23  2:08 PM  Result Value Ref Range   WBC 5.2 3.4 - 10.8 x10E3/uL   RBC 4.81 3.77 - 5.28 x10E6/uL   Hemoglobin 13.6 11.1 - 15.9 g/dL   Hematocrit 16.1 09.6 - 46.6 %   MCV 85 79 - 97 fL   MCH 28.3 26.6 - 33.0 pg   MCHC 33.2 31.5 - 35.7 g/dL   RDW 04.5 40.9 - 81.1 %   Platelets 233 150 - 450 x10E3/uL  Lipid Profile   Collection Time: 07/20/23  2:10 PM  Result Value Ref Range   Cholesterol, Total 222 (H) 100 - 199 mg/dL   Triglycerides 914 (H) 0 - 149 mg/dL   HDL 53 >78 mg/dL   VLDL Cholesterol Cal 29 5 - 40 mg/dL   LDL Chol Calc (NIH) 295 (H) 0 - 99 mg/dL   Chol/HDL Ratio 4.2 0.0 - 4.4 ratio  HgB A1c   Collection Time: 07/20/23  2:10 PM  Result Value Ref Range   Hgb A1c MFr Bld 5.9 (H) 4.8 - 5.6 %   Est. average glucose Bld gHb Est-mCnc 123 mg/dL  Hepatitis C Antibody   Collection Time: 07/20/23  2:10 PM  Result Value Ref Range   Hep C Virus Ab Non Reactive Non Reactive      Assessment & Plan:   Problem List Items Addressed This Visit       Nervous and Auditory   Shingles of eyelid - Primary   Acute and improving with crusting to previous vesicle areas. Saw ophthalmology. Continue Valtrex 1000 MG TID until complete + Prednisone taper.  Triamcinolone cream 0.025% to apply if itching or irritation.  Monitor numbness, educated her on this.         Follow up plan: Return in about 4  days (around 10/12/2023) for Shingles.

## 2023-10-08 NOTE — Assessment & Plan Note (Signed)
 Acute and improving with crusting to previous vesicle areas. Saw ophthalmology. Continue Valtrex 1000 MG TID until complete + Prednisone taper.  Triamcinolone cream 0.025% to apply if itching or irritation.  Monitor numbness, educated her on this.

## 2023-10-12 ENCOUNTER — Ambulatory Visit: Admitting: Nurse Practitioner

## 2023-10-12 ENCOUNTER — Encounter: Payer: Self-pay | Admitting: Nurse Practitioner

## 2023-10-12 VITALS — BP 117/74 | HR 64 | Temp 98.0°F | Ht 67.0 in | Wt 174.0 lb

## 2023-10-12 DIAGNOSIS — B0239 Other herpes zoster eye disease: Secondary | ICD-10-CM | POA: Diagnosis not present

## 2023-10-12 MED ORDER — GABAPENTIN 100 MG PO CAPS: 100.0000 mg | ORAL_CAPSULE | Freq: Two times a day (BID) | ORAL | 3 refills | Status: DC

## 2023-10-12 NOTE — Patient Instructions (Signed)
Nerve Pain After Shingles Postherpetic neuralgia (PHN) is nerve pain you may get after you have shingles. Shingles is an infection that causes a painful rash and blisters. It's caused by the same germ that causes chickenpox. PHN affects the spot on your body where you had the shingles rash. It can last for 3 months after your rash has gone away. What are the causes? PHN may be caused by damage to your nerves. This damage may come from swelling from the shingles infection. What increases the risk? You may be more likely to get PHN if: You're older than 68 years of age. You have severe pain before your rash starts. You have a very bad rash. You have shingles in and around your eye. Your body defense system (immune system) is weak. What are the signs or symptoms? The main symptom of PHN is pain. The pain may: Be stabbing, burning, or shooting. Feel like an electric shock. Come and go, or it may be there all the time. Get worse if: Something touches your skin. The temperature goes up or down. You may also have itching. How is this diagnosed? PHN may be diagnosed based on: Your symptoms. Whether you've had shingles before. How is this treated? There's no cure, but treatment can help with the pain. Normal pain medicines may not help. You may need to work with an expert in treating pain to find what works best for you. Treatment may include: Anti-seizure medicines. Antidepressants. Strong pain medicines. A numbing patch worn on the skin. Shots of: Numbing medicines. Medicines to treat inflammation. Botulinum toxin. This can block pain signals and stop you from feeling pain. Follow these instructions at home: Medicines Take over-the-counter and prescription medicines only as told by your health care provider. Ask your provider if the medicine prescribed to you: Requires you to avoid driving or using machinery. Can cause trouble pooping or constipation. You may need to take these  actions to prevent or treat trouble pooping: Drink enough fluid to keep your pee (urine) pale yellow. Take over-the-counter or prescription medicines. Eat foods that are high in fiber, such as beans, whole grains, and fresh fruits and vegetables. Limit foods that are high in fat and processed sugars, such as fried or sweet foods. Managing pain  If told, put ice on the painful area. Put ice in a plastic bag. Place a towel between your skin and the bag. Leave the ice on for 20 minutes, 2-3 times a day. If your skin turns bright red, remove the ice right away to prevent skin damage. The risk of damage is higher if you can't feel pain, heat, or cold. Cover sensitive spots with a bandage, or dressing, to stop clothes from rubbing. Wear loose clothes. General instructions It may take a long time for you to get better. Work closely with your provider. Think about talking with a mental health care provider. They can help you find ways to cope with feeling overwhelmed or hopeless. Have a good support system at home. Think about joining a pain support group. How is this prevented? Vaccines are the best way to prevent shingles and PHN. You should get the vaccine shot for shingles once you're older than 68 years of age. Talk with your provider about getting the shot. Contact a health care provider if: Your medicine isn't helping. You can't manage your pain at home. You feel sad or depressed. Get help right away if: You have thoughts about hurting yourself or others. Get help right away if  you feel like you may hurt yourself or others, or have thoughts about taking your own life. Go to your nearest emergency room or: Call 911. Call the National Suicide Prevention Lifeline at (414)038-7354 or 988. This is open 24 hours a day. Text the Crisis Text Line at (340) 829-8385. This information is not intended to replace advice given to you by your health care provider. Make sure you discuss any questions you have  with your health care provider. Document Revised: 10/12/2022 Document Reviewed: 10/12/2022 Elsevier Patient Education  2024 [employer].

## 2023-10-12 NOTE — Progress Notes (Signed)
 BP 117/74   Pulse 64   Temp 98 F (36.7 C) (Oral)   Ht 5\' 7"  (1.702 m)   Wt 174 lb (78.9 kg)   SpO2 98%   BMI 27.25 kg/m    Subjective:    Patient ID: [name], female    DOB: 10/21/1955, 68 y.o.   MRN: [id number]  HPI: [name] is a 68 y.o. female  Chief Complaint  Patient presents with   Herpes Zoster    Patient states she is having a lot of itchiness and numbness where the rashes are    SHINGLES Diagnosed with shingles on 10/02/23, to left forehead and eyelid.  Treated with Valtrex.  Ophthalmology on the 11th and overall reassuring eye exam with no drops needed.  On the 14th sent in Prednisone to assist with swelling to area.  Is having some ongoing numbness to upper forehead area, but no pain, and itching to area.  She is taking antihistamine, Zyrtec.   Duration: days Location:  left forehead and eyelid Painful:  no Paresthesia:  no Hyperesthesia: no Itching:  occasionally itchy Burning:  no Oozing:  no Blisters: improved Fevers:  no History of the same:  no Alleviating factors: Valtrex Status: improving Treatments attempted: Valtrex  Relevant past medical, surgical, family and social history reviewed and updated as indicated. Interim medical history since our last visit reviewed. Allergies and medications reviewed and updated.  Review of Systems  Constitutional:  Negative for activity change, appetite change, fatigue and fever.  Eyes:  Negative for photophobia, pain, discharge, redness, itching and visual disturbance.  Respiratory:  Negative for cough, chest tightness, shortness of breath and wheezing.   Cardiovascular:  Negative for chest pain, palpitations and leg swelling.  Skin:  Positive for rash.  Neurological: Negative.   Psychiatric/Behavioral: Negative.     Per HPI unless specifically indicated above     Objective:    BP 117/74   Pulse 64   Temp 98 F (36.7 C) (Oral)   Ht 5\' 7"  (1.702 m)   Wt 174 lb (78.9 kg)   SpO2 98%   BMI  27.25 kg/m   Wt Readings from Last 3 Encounters:  10/12/23 174 lb (78.9 kg)  10/08/23 174 lb 12.8 oz (79.3 kg)  10/04/23 175 lb (79.4 kg)    Physical Exam Vitals and nursing note reviewed.  Constitutional:      General: She is awake. She is not in acute distress.    Appearance: She is well-developed and well-groomed. She is not ill-appearing or toxic-appearing.  HENT:     Head: Normocephalic.     Right Ear: Hearing and external ear normal.     Left Ear: Hearing and external ear normal.  Eyes:     General: Lids are normal.        Right eye: No discharge.        Left eye: No discharge.     Conjunctiva/sclera:     Right eye: Right conjunctiva is not injected.     Left eye: Left conjunctiva is not injected. No hemorrhage.    Pupils: Pupils are equal, round, and reactive to light.     Visual Fields: Right eye visual fields normal and left eye visual fields normal.     Comments: Swelling now minimal, much improved.  Neck:     Thyroid: No thyromegaly.     Vascular: No carotid bruit.  Cardiovascular:     Rate and Rhythm: Normal rate and regular rhythm.  Heart sounds: Normal heart sounds. No murmur heard.    No gallop.  Pulmonary:     Effort: Pulmonary effort is normal. No accessory muscle usage or respiratory distress.     Breath sounds: Normal breath sounds.  Abdominal:     General: Bowel sounds are normal. There is no distension.     Palpations: Abdomen is soft.     Tenderness: There is no abdominal tenderness.  Musculoskeletal:     Cervical back: Normal range of motion and neck supple.     Right lower leg: No edema.     Left lower leg: No edema.  Lymphadenopathy:     Cervical: No cervical adenopathy.  Skin:    General: Skin is warm and dry.     Findings: Rash present.     Comments: Rash crusted over, no active vesicles present. Only small amounts of rash remain.  Neurological:     Mental Status: She is alert and oriented to person, place, and time.     Deep Tendon  Reflexes: Reflexes are normal and symmetric.     Reflex Scores:      Brachioradialis reflexes are 2+ on the right side and 2+ on the left side.      Patellar reflexes are 2+ on the right side and 2+ on the left side. Psychiatric:        Attention and Perception: Attention normal.        Mood and Affect: Mood normal.        Speech: Speech normal.        Behavior: Behavior normal. Behavior is cooperative.        Thought Content: Thought content normal.    Results for orders placed or performed in visit on 07/20/23  CBC   Collection Time: 07/20/23  2:08 PM  Result Value Ref Range   WBC 5.2 3.4 - 10.8 x10E3/uL   RBC 4.81 3.77 - 5.28 x10E6/uL   Hemoglobin 13.6 11.1 - 15.9 g/dL   Hematocrit 91.4 78.2 - 46.6 %   MCV 85 79 - 97 fL   MCH 28.3 26.6 - 33.0 pg   MCHC 33.2 31.5 - 35.7 g/dL   RDW 95.6 21.3 - 08.6 %   Platelets 233 150 - 450 x10E3/uL  Lipid Profile   Collection Time: 07/20/23  2:10 PM  Result Value Ref Range   Cholesterol, Total 222 (H) 100 - 199 mg/dL   Triglycerides 578 (H) 0 - 149 mg/dL   HDL 53 >46 mg/dL   VLDL Cholesterol Cal 29 5 - 40 mg/dL   LDL Chol Calc (NIH) 962 (H) 0 - 99 mg/dL   Chol/HDL Ratio 4.2 0.0 - 4.4 ratio  HgB A1c   Collection Time: 07/20/23  2:10 PM  Result Value Ref Range   Hgb A1c MFr Bld 5.9 (H) 4.8 - 5.6 %   Est. average glucose Bld gHb Est-mCnc 123 mg/dL  Hepatitis C Antibody   Collection Time: 07/20/23  2:10 PM  Result Value Ref Range   Hep C Virus Ab Non Reactive Non Reactive      Assessment & Plan:   Problem List Items Addressed This Visit       Nervous and Auditory   Shingles of eyelid - Primary   Acute and improving with crusting. Saw ophthalmology with reassuring report. 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  Will trial Gabapentin starting at 100 MG at night and if tolerates over next week can try a  daytime dose. Triamcinolone cream 0.025% to apply if itching or irritation.   Educated her on symptoms.            Follow up plan: Return for as scheduled next week with Dr. Evelene Croon.

## 2023-10-12 NOTE — Assessment & Plan Note (Signed)
 Acute and improving with crusting. Saw ophthalmology with reassuring report. 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  Will trial Gabapentin starting at 100 MG at night and if tolerates over next week can try a daytime dose. Triamcinolone cream 0.025% to apply if itching or irritation.  Educated her on symptoms.

## 2023-10-17 ENCOUNTER — Encounter: Payer: Self-pay | Admitting: Pediatrics

## 2023-10-17 ENCOUNTER — Ambulatory Visit: Admitting: Pediatrics

## 2023-10-17 VITALS — BP 122/71 | HR 64 | Temp 97.5°F | Wt 174.4 lb

## 2023-10-17 DIAGNOSIS — R030 Elevated blood-pressure reading, without diagnosis of hypertension: Secondary | ICD-10-CM | POA: Diagnosis not present

## 2023-10-17 DIAGNOSIS — Z1330 Encounter for screening examination for mental health and behavioral disorders, unspecified: Secondary | ICD-10-CM | POA: Diagnosis not present

## 2023-10-17 DIAGNOSIS — E785 Hyperlipidemia, unspecified: Secondary | ICD-10-CM

## 2023-10-17 DIAGNOSIS — B0239 Other herpes zoster eye disease: Secondary | ICD-10-CM | POA: Diagnosis not present

## 2023-10-17 MED ORDER — SILVER SULFADIAZINE 1 % EX CREA: 1.0000 | TOPICAL_CREAM | Freq: Every day | CUTANEOUS | 0 refills | Status: AC

## 2023-10-17 MED ORDER — ATORVASTATIN CALCIUM 10 MG PO TABS: 10.0000 mg | ORAL_TABLET | Freq: Every day | ORAL | 2 refills | Status: DC

## 2023-10-17 NOTE — Patient Instructions (Signed)
 Stop steroid, apply silvadene only

## 2023-10-17 NOTE — Assessment & Plan Note (Signed)
 On atorvastatin 10 mg daily. Maintaining lifestyle changes. - Ensure atorvastatin prescription is refilled. - Reassess lipid levels at next follow-up.

## 2023-10-17 NOTE — Assessment & Plan Note (Signed)
 Blood pressure well-controlled with lifestyle modifications. Significant improvement noted. - Cancel April follow-up for blood pressure monitoring. - Schedule follow-up in June to reassess blood pressure.

## 2023-10-17 NOTE — Progress Notes (Signed)
 Office Visit  BP 122/71   Pulse 64   Temp (!) 97.5 F (36.4 C) (Oral)   Wt 174 lb 6.4 oz (79.1 kg)   SpO2 98%   BMI 27.31 kg/m    Subjective:    Patient ID: [name], female    DOB: 03-12-1956, 68 y.o.   MRN: [id number]  HPI: [name] is a 68 y.o. female  Chief Complaint  Patient presents with   Herpes Zoster    Discussed the use of AI scribe software for clinical note transcription with the patient, who gave verbal consent to proceed.  History of Present Illness   [name] is a 68 year old female who presents with concerns about eye symptoms and skin healing related to shingles.  She has been experiencing symptoms related to shingles for several weeks, having completed a course of Valtrex approximately a week ago. There is improvement, but she remains concerned about pressure around her eye, swelling, drooping, and watering. Her eye appears different in size, likely due to drooping and swelling, and she uses Systane eye drops to manage dryness and watering.  She has been using a steroid cream but stopped in the last couple of days due to redness. She describes the rash as feeling like a 'first degree burn' and has been using Moderma with SPF. She has avoided makeup to allow the skin to heal. Initially, the rash responded to the steroid cream but later caused irritation.  She is currently taking gabapentin 100 mg BID, which she finds helpful and does not cause sleepiness. She also takes atorvastatin 10 mg daily. She mentions a past steroid injection for knee inflammation, which she suspects may have triggered the shingles outbreak.  She experiences numbness in the affected area and has postponed a hair appointment due to lack of sensation. She describes the numbness as feeling like 'a plate' on her head. She also reports light sensitivity and occasional itching and pain in the affected area.  She has a small patch on her back that is sensitive to touch, which she  attributes to muscle soreness or lymphatic swelling. She uses sterile pads to dry her eye and is cautious about skin care products to avoid irritation.  No active lesions. No sleepiness from gabapentin. Light sensitivity and occasional itching and pain in the affected area. Numbness in the forehead and around the eye. Watering of the eye and swelling.       Relevant past medical, surgical, family and social history reviewed and updated as indicated. Interim medical history since our last visit reviewed. Allergies and medications reviewed and updated.  ROS per HPI unless specifically indicated above     Objective:    BP 122/71   Pulse 64   Temp (!) 97.5 F (36.4 C) (Oral)   Wt 174 lb 6.4 oz (79.1 kg)   SpO2 98%   BMI 27.31 kg/m   Wt Readings from Last 3 Encounters:  10/17/23 174 lb 6.4 oz (79.1 kg)  10/12/23 174 lb (78.9 kg)  10/08/23 174 lb 12.8 oz (79.3 kg)     Physical Exam Constitutional:      Appearance: Normal appearance.  Pulmonary:     Effort: Pulmonary effort is normal.  Musculoskeletal:        General: Normal range of motion.  Skin:    Findings: Rash present.     Comments: Erythematous patch over left eye lid with some swelling  Neurological:     General: No focal deficit present.  Mental Status: She is alert. Mental status is at baseline.  Psychiatric:        Mood and Affect: Mood normal.        Behavior: Behavior normal.        Thought Content: Thought content normal.         10/17/2023    1:29 PM 10/04/2023    8:23 AM 10/02/2023   11:28 AM 08/02/2023    1:10 PM 07/20/2023    1:05 PM  Depression screen PHQ 2/9  Decreased Interest 0 0 0 0 0  Down, Depressed, Hopeless 0 0 0 0 0  PHQ - 2 Score 0 0 0 0 0  Altered sleeping 0 1 1 0 0  Tired, decreased energy 0 0 1 0 0  Change in appetite 0 1 0 0 0  Feeling bad or failure about yourself  0 0 0 0 0  Trouble concentrating 0 0 0 0 0  Moving slowly or fidgety/restless 0 0 0 0 0  Suicidal thoughts 0 0 0 0  0  PHQ-9 Score 0 2 2 0 0  Difficult doing work/chores Not difficult at all  Not difficult at all Not difficult at all Not difficult at all       10/17/2023    1:29 PM 10/04/2023    8:22 AM 08/02/2023    1:10 PM 05/22/2023   11:33 AM  GAD 7 : Generalized Anxiety Score  Nervous, Anxious, on Edge 0 0 0 0  Control/stop worrying 0 0 0 0  Worry too much - different things 0 0 0 0  Trouble relaxing 0 0 0 0  Restless 0 0 0 0  Easily annoyed or irritable 0 0 0 0  Afraid - awful might happen 0 0 1 0  Total GAD 7 Score 0 0 1 0  Anxiety Difficulty Not difficult at all  Not difficult at all Not difficult at all       Assessment & Plan:  Assessment & Plan   Shingles of eyelid Post-treatment symptoms include swelling, drooping, and watering of the eye. No active lesions. Condition improving. Valtrex and triamcinolone cream discontinued. Silvadene cream recommended. Gabapentin effective for pain. Systane eye drops appropriate for hydration. Ophthalmology confirmed no further interventions needed. - Prescribe Silvadene cream for topical application. - Continue gabapentin for pain management. - Use Systane eye drops liberally. - Stop triamcinolone cream. - Schedule follow-up in three weeks. - Advise to contact clinic if symptoms worsen. -     Silver sulfADIAZINE; Apply 1 Application topically daily.  Dispense: 50 g; Refill: 0  Hyperlipidemia, unspecified hyperlipidemia type Assessment & Plan: On atorvastatin 10 mg daily. Maintaining lifestyle changes. - Ensure atorvastatin prescription is refilled. - Reassess lipid levels at next follow-up.  Orders: -     Atorvastatin Calcium; Take 1 tablet (10 mg total) by mouth daily.  Dispense: 90 tablet; Refill: 2  Elevated blood pressure reading Assessment & Plan: Blood pressure well-controlled with lifestyle modifications. Significant improvement noted. - Cancel April follow-up for blood pressure monitoring. - Schedule follow-up in June to reassess  blood pressure.   Encounter for behavioral health screening As part of their intake evaluation, the patient was screened for depression, anxiety.  PHQ9 SCORE 0, GAD7 SCORE 0. Screening results negative for tested conditions. CTM.  General Health Maintenance Considering Shingrix vaccine for shingles prevention. Timing considered to avoid potential reinfection. - Discuss Shingrix vaccination at June appointment or next annual physical.     Follow up plan: Return if  symptoms worsen or fail to improve.  Jackolyn Confer, MD

## 2023-10-25 ENCOUNTER — Telehealth: Payer: Self-pay | Admitting: Pediatrics

## 2023-10-25 NOTE — Telephone Encounter (Signed)
 Returned call to patient. Provider is not currently in office and will be back next week. She is welcome to schedule with Dr. Evelene Croon next week if there is any openings. Or she can also see Jolene which she has also been seen also very recently.

## 2023-10-25 NOTE — Telephone Encounter (Signed)
 Copied from CRM [id number]. Topic: Appointments - Scheduling Inquiry for Clinic >> Oct 24, 2023 10:13 AM Fuller Mandril wrote: Reason for CRM: Patient called stated she needs appt with Dr Evelene Croon for tomorrow or Friday regarding what was seen for. Would not provide any information about symptoms. States she will provide that information to Dr Evelene Croon. Patient says provider told her to call back for an appt if she was having any trouble with current condition and she is. She declined checking for other providers. She would like a callback today before 1. Thank You

## 2023-10-30 ENCOUNTER — Encounter: Payer: Self-pay | Admitting: Pediatrics

## 2023-10-30 ENCOUNTER — Ambulatory Visit: Admitting: Pediatrics

## 2023-10-30 VITALS — BP 119/74 | HR 57 | Wt 174.6 lb

## 2023-10-30 DIAGNOSIS — B029 Zoster without complications: Secondary | ICD-10-CM

## 2023-10-30 DIAGNOSIS — L905 Scar conditions and fibrosis of skin: Secondary | ICD-10-CM | POA: Diagnosis not present

## 2023-10-30 DIAGNOSIS — L299 Pruritus, unspecified: Secondary | ICD-10-CM | POA: Diagnosis not present

## 2023-10-30 DIAGNOSIS — B0239 Other herpes zoster eye disease: Secondary | ICD-10-CM

## 2023-10-30 MED ORDER — HYDROXYZINE HCL 10 MG PO TABS: 5.0000 mg | ORAL_TABLET | Freq: Three times a day (TID) | ORAL | 0 refills | Status: DC | PRN

## 2023-10-30 NOTE — Progress Notes (Unsigned)
   Office Visit  BP 119/74   Pulse (!) 57   Wt 174 lb 9.6 oz (79.2 kg)   SpO2 100%   BMI 27.35 kg/m    Subjective:    Patient ID: [name], female    DOB: [birth year], 68 y.o.   MRN: [id number]  HPI: [name] is a 68 y.o. female  No chief complaint on file.   Discussed the use of AI scribe software for clinical note transcription with the patient, who gave verbal consent to proceed.  History of Present Illness     Relevant past medical, surgical, family and social history reviewed and updated as indicated. Interim medical history since our last visit reviewed. Allergies and medications reviewed and updated.  ROS per HPI unless specifically indicated above     Objective:    BP 119/74   Pulse (!) 57   Wt 174 lb 9.6 oz (79.2 kg)   SpO2 100%   BMI 27.35 kg/m   Wt Readings from Last 3 Encounters:  10/30/23 174 lb 9.6 oz (79.2 kg)  10/17/23 174 lb 6.4 oz (79.1 kg)  10/12/23 174 lb (78.9 kg)     Physical Exam      10/17/2023    1:29 PM 10/04/2023    8:23 AM 10/02/2023   11:28 AM 08/02/2023    1:10 PM 07/20/2023    1:05 PM  Depression screen PHQ 2/9  Decreased Interest 0 0 0 0 0  Down, Depressed, Hopeless 0 0 0 0 0  PHQ - 2 Score 0 0 0 0 0  Altered sleeping 0 1 1 0 0  Tired, decreased energy 0 0 1 0 0  Change in appetite 0 1 0 0 0  Feeling bad or failure about yourself  0 0 0 0 0  Trouble concentrating 0 0 0 0 0  Moving slowly or fidgety/restless 0 0 0 0 0  Suicidal thoughts 0 0 0 0 0  PHQ-9 Score 0 2 2 0 0  Difficult doing work/chores Not difficult at all  Not difficult at all Not difficult at all Not difficult at all       10/17/2023    1:29 PM 10/04/2023    8:22 AM 08/02/2023    1:10 PM 05/22/2023   11:33 AM  GAD 7 : Generalized Anxiety Score  Nervous, Anxious, on Edge 0 0 0 0  Control/stop worrying 0 0 0 0  Worry too much - different things 0 0 0 0  Trouble relaxing 0 0 0 0  Restless 0 0 0 0  Easily annoyed or irritable 0 0 0 0  Afraid -  awful might happen 0 0 1 0  Total GAD 7 Score 0 0 1 0  Anxiety Difficulty Not difficult at all  Not difficult at all Not difficult at all       Assessment & Plan:  Assessment & Plan   There are no diagnoses linked to this encounter.   Assessment and Plan Assessment & Plan      Follow up plan: No follow-ups on file.  Jackolyn Confer, MD

## 2023-10-30 NOTE — Patient Instructions (Addendum)
 Sarna - over the counter as first line for itchiness EMLA - sample numbing cream to try, if you want me to send this let me know  Hydroxyzine as needed - max 30mg /day

## 2023-10-31 ENCOUNTER — Encounter: Payer: Self-pay | Admitting: Pediatrics

## 2023-11-08 ENCOUNTER — Encounter: Payer: Self-pay | Admitting: Pediatrics

## 2023-11-08 ENCOUNTER — Ambulatory Visit: Payer: Self-pay | Admitting: Pediatrics

## 2023-11-08 VITALS — BP 132/74 | HR 64 | Temp 98.1°F | Wt 174.6 lb

## 2023-11-08 DIAGNOSIS — E785 Hyperlipidemia, unspecified: Secondary | ICD-10-CM

## 2023-11-08 DIAGNOSIS — B029 Zoster without complications: Secondary | ICD-10-CM | POA: Diagnosis not present

## 2023-11-08 DIAGNOSIS — Z1330 Encounter for screening examination for mental health and behavioral disorders, unspecified: Secondary | ICD-10-CM

## 2023-11-08 DIAGNOSIS — R7303 Prediabetes: Secondary | ICD-10-CM

## 2023-11-08 DIAGNOSIS — R030 Elevated blood-pressure reading, without diagnosis of hypertension: Secondary | ICD-10-CM

## 2023-11-08 DIAGNOSIS — H1012 Acute atopic conjunctivitis, left eye: Secondary | ICD-10-CM

## 2023-11-08 NOTE — Patient Instructions (Signed)
 Continue silvadene Once ready to come off gabapentin, take just 1 for a week then stop

## 2023-11-08 NOTE — Progress Notes (Signed)
 Office Visit  BP 132/74   Pulse 64   Temp 98.1 F (36.7 C) (Oral)   Wt 174 lb 9.6 oz (79.2 kg)   SpO2 99%   BMI 27.35 kg/m    Subjective:    Patient ID: [name], female    DOB: July 23, 1956, 68 y.o.   MRN: [id number]  HPI: [name] is a 68 y.o. female  Chief Complaint  Patient presents with   Follow-up    Discussed the use of AI scribe software for clinical note transcription with the patient, who gave verbal consent to proceed.  History of Present Illness   [name] is a 68 year old female who presents for a follow-up visit regarding medication side effects and light sensitivity.  She experienced significant dryness and pain after taking hydroxyzine 10 mg for the first time last night, leading her to discontinue the medication.  She has ongoing light sensitivity, particularly in her right eye, which she attributes to pollen exposure. She experiences symptoms similar to conjunctivitis, such as dryness and irritation, and uses Systane drops frequently to manage these symptoms. No pus in her eyes, but some crusting attributed to eye makeup.  She continues to experience numbness in a patch on her head, described as feeling like 'a hunk of leather football' under her head. There is some improvement in forehead sensation, but recovery is slow. She is cautious about hair treatments due to the numbness and plans to schedule extra time with her hairdresser. She is taking gabapentin 100 mg twice a day and uses Silvadene occasionally, avoiding triamcinolone due to skin-thinning effects.  She discusses her blood pressure, noting a recent reading of 132/70, which she attributes to dietary factors such as salt intake and hydration. She is working on reducing caffeine and improving her diet, aiming to lose weight before her high school reunion in August.      Relevant past medical, surgical, family and social history reviewed and updated as indicated. Interim medical history  since our last visit reviewed. Allergies and medications reviewed and updated.  ROS per HPI unless specifically indicated above     Objective:    BP 132/74   Pulse 64   Temp 98.1 F (36.7 C) (Oral)   Wt 174 lb 9.6 oz (79.2 kg)   SpO2 99%   BMI 27.35 kg/m   Wt Readings from Last 3 Encounters:  11/08/23 174 lb 9.6 oz (79.2 kg)  10/30/23 174 lb 9.6 oz (79.2 kg)  10/17/23 174 lb 6.4 oz (79.1 kg)     Physical Exam Constitutional:      Appearance: Normal appearance.  HENT:     Head: Normocephalic and atraumatic.  Eyes:     Pupils: Pupils are equal, round, and reactive to light.  Cardiovascular:     Rate and Rhythm: Normal rate and regular rhythm.     Pulses: Normal pulses.     Heart sounds: Normal heart sounds.  Pulmonary:     Effort: Pulmonary effort is normal.     Breath sounds: Normal breath sounds.  Musculoskeletal:        General: Normal range of motion.     Cervical back: Normal range of motion.  Skin:    General: Skin is warm and dry.     Capillary Refill: Capillary refill takes less than 2 seconds.  Neurological:     General: No focal deficit present.     Mental Status: She is alert. Mental status is at baseline.  Psychiatric:  Mood and Affect: Mood normal.        Behavior: Behavior normal.         11/08/2023    9:26 AM 10/17/2023    1:29 PM 10/04/2023    8:23 AM 10/02/2023   11:28 AM 08/02/2023    1:10 PM  Depression screen PHQ 2/9  Decreased Interest 0 0 0 0 0  Down, Depressed, Hopeless 0 0 0 0 0  PHQ - 2 Score 0 0 0 0 0  Altered sleeping 0 0 1 1 0  Tired, decreased energy 0 0 0 1 0  Change in appetite 0 0 1 0 0  Feeling bad or failure about yourself  0 0 0 0 0  Trouble concentrating 0 0 0 0 0  Moving slowly or fidgety/restless 0 0 0 0 0  Suicidal thoughts 0 0 0 0 0  PHQ-9 Score 0 0 2 2 0  Difficult doing work/chores Not difficult at all Not difficult at all  Not difficult at all Not difficult at all       11/08/2023    9:26 AM 10/17/2023     1:29 PM 10/04/2023    8:22 AM 08/02/2023    1:10 PM  GAD 7 : Generalized Anxiety Score  Nervous, Anxious, on Edge 0 0 0 0  Control/stop worrying 0 0 0 0  Worry too much - different things 0 0 0 0  Trouble relaxing 0 0 0 0  Restless 0 0 0 0  Easily annoyed or irritable 0 0 0 0  Afraid - awful might happen 0 0 0 1  Total GAD 7 Score 0 0 0 1  Anxiety Difficulty Not difficult at all Not difficult at all  Not difficult at all       Assessment & Plan:  Assessment & Plan   Hyperlipidemia, unspecified hyperlipidemia type Prediabetes Interested in portion control and dietary recommendations. Motivated by upcoming high school reunion. - Refer to a nutritionist for dietary counseling, focusing on portion control and the Mediterranean diet. -     Amb ref to Medical Nutrition Therapy-MNT  Herpes zoster without complication Persistent numbness and altered sensation in the scalp area post-shingles. Slow improvement noted. Caution with hair treatments due to heat sensitivity. - Continue gabapentin 100 mg twice daily for neuropathic pain. - Consider tapering gabapentin to once daily for a week before discontinuation. - Continue Silvadene cream to aid healing. - Avoid hydroxyzine due to adverse effects.  Elevated blood pressure reading Blood pressure well-managed with lifestyle modifications. Recent systolic at 132 mmHg, diastolic in the 70s. Asymptomatic.   Allergic conjunctivitis of left eye Photophobia and dryness in the right eye likely due to pollen exposure. No infection signs. Crusting likely from makeup and dry eyes. - Continue Systane drops liberally for eye lubrication. - Consider ophthalmology referral if symptoms worsen or affect daily activities.  Encounter for behavioral health screening As part of their intake evaluation, the patient was screened for depression, anxiety.  PHQ9 SCORE 0, GAD7 SCORE 0. Screening results negative for tested conditions. See plan under  problem/diagnosis above.   Follow up plan: Return in about 2 months (around 01/15/2024).  Hadassah Letters, MD

## 2023-11-09 ENCOUNTER — Other Ambulatory Visit: Payer: Self-pay | Admitting: Pediatrics

## 2023-11-09 DIAGNOSIS — Z1211 Encounter for screening for malignant neoplasm of colon: Secondary | ICD-10-CM

## 2023-11-09 NOTE — Progress Notes (Signed)
 Cologuard order for colon cancer screen.  Hadassah Letters, MD

## 2023-11-12 ENCOUNTER — Encounter: Payer: Self-pay | Admitting: Pediatrics

## 2023-11-20 LAB — COLOGUARD: COLOGUARD: NEGATIVE

## 2024-01-10 ENCOUNTER — Ambulatory Visit: Admitting: Pediatrics

## 2024-01-10 ENCOUNTER — Encounter: Payer: Self-pay | Admitting: Pediatrics

## 2024-01-10 ENCOUNTER — Ambulatory Visit: Payer: Self-pay | Admitting: Pediatrics

## 2024-01-10 VITALS — BP 122/76 | HR 63 | Temp 97.8°F | Wt 169.8 lb

## 2024-01-10 DIAGNOSIS — R7303 Prediabetes: Secondary | ICD-10-CM

## 2024-01-10 DIAGNOSIS — B0229 Other postherpetic nervous system involvement: Secondary | ICD-10-CM | POA: Insufficient documentation

## 2024-01-10 DIAGNOSIS — E785 Hyperlipidemia, unspecified: Secondary | ICD-10-CM

## 2024-01-10 LAB — CBC
Hematocrit: 41.1 % (ref 34.0–46.6)
Hemoglobin: 13.7 g/dL (ref 11.1–15.9)
MCH: 29 pg (ref 26.6–33.0)
MCHC: 33.3 g/dL (ref 31.5–35.7)
MCV: 87 fL (ref 79–97)
Platelets: 218 10*3/uL (ref 150–450)
RBC: 4.72 x10E6/uL (ref 3.77–5.28)
RDW: 14.1 % (ref 11.7–15.4)
WBC: 4.7 10*3/uL (ref 3.4–10.8)

## 2024-01-10 MED ORDER — PREGABALIN 50 MG PO CAPS: 50.0000 mg | ORAL_CAPSULE | Freq: Two times a day (BID) | ORAL | 2 refills | Status: DC

## 2024-01-10 NOTE — Assessment & Plan Note (Signed)
 On atorvastatin . Mediterranean diet and weight loss may improve cholesterol. Awaiting lipid panel results for atorvastatin  adjustment. Blood pressure WNL with weight loss and dietary changes. ASCVD 8.9%. - Continue dietary and lifestyle modifications. - Consider virtual dietitian consultation for meal planning.

## 2024-01-10 NOTE — Assessment & Plan Note (Signed)
 Persistent forehead burning, numbness, and itching post-shingles. Gabapentin  100 mg BID insufficient. Pregabalin may offer better control with similar side effects. - Discontinue gabapentin . - Initiate pregabalin 50 mg BID. - Monitor for drowsiness. - Revert to gabapentin  if pregabalin ineffective or causes side effects. - Encourage scalp massages and sensory stimulation.

## 2024-01-10 NOTE — Progress Notes (Signed)
 Office Visit  BP 122/76   Pulse 63   Temp 97.8 F (36.6 C)   Wt 169 lb 12.8 oz (77 kg)   SpO2 100%   BMI 26.59 kg/m    Subjective:    Patient ID: [name], female    DOB: 1955-11-26, 68 y.o.   MRN: [id number]  HPI: [name] is a 68 y.o. female  Chief Complaint  Patient presents with   Hypertension    Discussed the use of AI scribe software for clinical note transcription with the patient, who gave verbal consent to proceed.  History of Present Illness   [name] is a 68 year old female who presents for follow-up regarding neuropathic pain management.  She experiences ongoing neuropathic pain and numbness in the forehead area following a shingles outbreak. The sensation is described as a burning feeling, similar to sunburn, with associated itching in the eye. Eye drops are used to manage the itching, and blinking helps alleviate discomfort. Massaging the scalp provides some relief for the pain.  She is currently taking gabapentin  100 mg twice daily for neuropathic pain without experiencing side effects such as drowsiness. Additionally, she is on atorvastatin  and Fosamax , with the latter taken with plenty of water.  She has been actively managing her weight, reporting a weight loss down to the 160s. This is attributed to dietary changes, including portion control and increased consumption of beans, lean meats, and fish, while avoiding fatty foods, salt, and sugar. Truvia is used as a sugar substitute. Her brother has been supportive in her dietary efforts.  She mentions a high school reunion at the end of August and expresses concerns about flying, although she has no history of issues with flying. She has been experiencing nightmares about flying but has no medical contraindications related to her shingles for air travel.        Relevant past medical, surgical, family and social history reviewed and updated as indicated. Interim medical history since our last  visit reviewed. Allergies and medications reviewed and updated.  ROS per HPI unless specifically indicated above     Objective:    BP 122/76   Pulse 63   Temp 97.8 F (36.6 C)   Wt 169 lb 12.8 oz (77 kg)   SpO2 100%   BMI 26.59 kg/m   Wt Readings from Last 3 Encounters:  01/10/24 169 lb 12.8 oz (77 kg)  11/08/23 174 lb 9.6 oz (79.2 kg)  10/30/23 174 lb 9.6 oz (79.2 kg)     Physical Exam Constitutional:      Appearance: Normal appearance.  Pulmonary:     Effort: Pulmonary effort is normal.   Musculoskeletal:        General: Normal range of motion.   Skin:    Comments: Normal skin color   Neurological:     General: No focal deficit present.     Mental Status: She is alert. Mental status is at baseline.   Psychiatric:        Mood and Affect: Mood normal.        Behavior: Behavior normal.        Thought Content: Thought content normal.         01/10/2024    8:14 AM 11/08/2023    9:26 AM 10/17/2023    1:29 PM 10/04/2023    8:23 AM 10/02/2023   11:28 AM  Depression screen PHQ 2/9  Decreased Interest 0 0 0 0 0  Down, Depressed, Hopeless 0 0 0  0 0  PHQ - 2 Score 0 0 0 0 0  Altered sleeping 0 0 0 1 1  Tired, decreased energy 0 0 0 0 1  Change in appetite 0 0 0 1 0  Feeling bad or failure about yourself  0 0 0 0 0  Trouble concentrating 0 0 0 0 0  Moving slowly or fidgety/restless 0 0 0 0 0  Suicidal thoughts 0 0 0 0 0  PHQ-9 Score 0 0 0 2 2  Difficult doing work/chores Not difficult at all Not difficult at all Not difficult at all  Not difficult at all       01/10/2024    8:15 AM 11/08/2023    9:26 AM 10/17/2023    1:29 PM 10/04/2023    8:22 AM  GAD 7 : Generalized Anxiety Score  Nervous, Anxious, on Edge 0 0 0 0  Control/stop worrying 0 0 0 0  Worry too much - different things 0 0 0 0  Trouble relaxing 0 0 0 0  Restless 0 0 0 0  Easily annoyed or irritable 0 0 0 0  Afraid - awful might happen 0 0 0 0  Total GAD 7 Score 0 0 0 0  Anxiety Difficulty Not  difficult at all Not difficult at all Not difficult at all        Assessment & Plan:  Assessment & Plan   Post herpetic neuralgia Assessment & Plan: Persistent forehead burning, numbness, and itching post-shingles. Gabapentin  100 mg BID insufficient. Pregabalin may offer better control with similar side effects. - Discontinue gabapentin . - Initiate pregabalin 50 mg BID. - Monitor for drowsiness. - Revert to gabapentin  if pregabalin ineffective or causes side effects. - Encourage scalp massages and sensory stimulation.  Orders: -     CBC -     Comprehensive metabolic panel with GFR -     Pregabalin; Take 1 capsule (50 mg total) by mouth 2 (two) times daily.  Dispense: 60 capsule; Refill: 2  Hyperlipidemia, unspecified hyperlipidemia type Assessment & Plan: On atorvastatin . Mediterranean diet and weight loss may improve cholesterol. Awaiting lipid panel results for atorvastatin  adjustment. Blood pressure WNL with weight loss and dietary changes. ASCVD 8.9%. - Continue dietary and lifestyle modifications. - Consider virtual dietitian consultation for meal planning.  Orders: -     Lipid panel  Prediabetes Due for repeat level. -     Hemoglobin A1c  Follow up plan: Return in about 3 months (around 04/11/2024) for postherpetic.  Hadassah Letters, MD

## 2024-01-10 NOTE — Patient Instructions (Signed)
 Keep up the great work!

## 2024-01-10 NOTE — Assessment & Plan Note (Deleted)
 Blood pressure improving with weight loss and dietary changes.   - Continue dietary and lifestyle modifications. - Consider virtual dietitian consultation for meal planning.

## 2024-01-11 ENCOUNTER — Other Ambulatory Visit: Payer: Self-pay | Admitting: Pediatrics

## 2024-01-11 DIAGNOSIS — E785 Hyperlipidemia, unspecified: Secondary | ICD-10-CM

## 2024-01-11 LAB — COMPREHENSIVE METABOLIC PANEL WITH GFR
ALT: 19 IU/L (ref 0–32)
AST: 19 IU/L (ref 0–40)
Albumin: 4.6 g/dL (ref 3.9–4.9)
Alkaline Phosphatase: 104 IU/L (ref 44–121)
BUN/Creatinine Ratio: 14 (ref 12–28)
BUN: 13 mg/dL (ref 8–27)
Bilirubin Total: 0.5 mg/dL (ref 0.0–1.2)
CO2: 24 mmol/L (ref 20–29)
Calcium: 9.8 mg/dL (ref 8.7–10.3)
Chloride: 103 mmol/L (ref 96–106)
Creatinine, Ser: 0.92 mg/dL (ref 0.57–1.00)
Globulin, Total: 2.3 g/dL (ref 1.5–4.5)
Glucose: 94 mg/dL (ref 70–99)
Potassium: 4.2 mmol/L (ref 3.5–5.2)
Sodium: 143 mmol/L (ref 134–144)
Total Protein: 6.9 g/dL (ref 6.0–8.5)
eGFR: 68 mL/min/{1.73_m2} (ref >59)

## 2024-01-11 LAB — LIPID PANEL
Chol/HDL Ratio: 2.7 ratio (ref 0.0–4.4)
Cholesterol, Total: 148 mg/dL (ref 100–199)
HDL: 54 mg/dL (ref >39)
LDL Chol Calc (NIH): 77 mg/dL (ref 0–99)
Triglycerides: 94 mg/dL (ref 0–149)
VLDL Cholesterol Cal: 17 mg/dL (ref 5–40)

## 2024-01-11 LAB — HEMOGLOBIN A1C
Est. average glucose Bld gHb Est-mCnc: 120 mg/dL
Hgb A1c MFr Bld: 5.8 % — ABNORMAL HIGH (ref 4.8–5.6)

## 2024-01-11 MED ORDER — ATORVASTATIN CALCIUM 10 MG PO TABS: 10.0000 mg | ORAL_TABLET | Freq: Every day | ORAL | 3 refills | Status: DC

## 2024-01-24 ENCOUNTER — Ambulatory Visit: Admitting: Nurse Practitioner

## 2024-03-01 ENCOUNTER — Other Ambulatory Visit: Payer: Self-pay | Admitting: Pediatrics

## 2024-03-01 DIAGNOSIS — B0229 Other postherpetic nervous system involvement: Secondary | ICD-10-CM

## 2024-03-04 NOTE — Telephone Encounter (Signed)
 Requested medication (s) are due for refill today: yes  Requested medication (s) are on the active medication list: yes  Last refill:  01/10/24  Future visit scheduled: {Yes  Notes to clinic:  Unable to refill per protocol, cannot delegate.      Requested Prescriptions  Pending Prescriptions Disp Refills   pregabalin  (LYRICA ) 50 MG capsule [Pharmacy Med Name: PREGABALIN  50 MG CAPSULE] 60 capsule 0    Sig: Take 1 capsule (50 mg total) by mouth 2 (two) times daily.     Not Delegated - Neurology:  Anticonvulsants - Controlled - pregabalin  Failed - 03/04/2024  4:07 PM      Failed - This refill cannot be delegated      Passed - Cr in normal range and within 360 days    Creatinine, Ser  Date Value Ref Range Status  01/10/2024 0.92 0.57 - 1.00 mg/dL Final         Passed - Completed PHQ-2 or PHQ-9 in the last 360 days      Passed - Valid encounter within last 12 months    Recent Outpatient Visits           1 month ago Post herpetic neuralgia   [place] [facility] Herold Hadassah SQUIBB, MD   3 months ago Hyperlipidemia, unspecified hyperlipidemia type   [place] [facility] Herold Hadassah SQUIBB, MD   4 months ago Herpes zoster without complication   [place] [facility] Her[facility] Hadassah SQUIBB, MD   4 months ago Shingles of eyelid   [facility] Herold Hadassah SQUIBB, MD   4 months ago Shingles of eyelid   [place] [facility] [place], Melanie DASEN, NP

## 2024-03-31 ENCOUNTER — Ambulatory Visit: Payer: Self-pay

## 2024-03-31 NOTE — Telephone Encounter (Signed)
 FYI Only or Action Required?: Action required by provider: request for appointment.  Patient was last seen in primary care on 01/10/2024 by Jeanne Hadassah SQUIBB, MD.  Called Nurse Triage reporting Back Pain.  Symptoms began yesterday.  Interventions attempted: Rest, hydration, or home remedies.  Symptoms are: completely resolved.  Triage Disposition: See PCP When Office is Open (Within 3 Days)  Patient/caregiver understands and will follow disposition?: Yes, will follow disposition  Pt disconnected prior to NT, successful outgoing with NT.   Copied from CRM #[id number]. Topic: Clinical - Red Word Triage >> Mar 31, 2024  8:29 AM Laymon HERO wrote: Red Word that prompted transfer to Nurse Triage: lower back and front abdominal pain- possible kidney stones- no pain at this time- did have some over the weekend Reason for Disposition  [1] Age > 50 AND [2] no history of prior similar back pain  Answer Assessment - Initial Assessment Questions 1. ONSET: When did the pain begin? (e.g., minutes, hours, days)     yesterday 2. LOCATION: Where does it hurt? (upper, mid or lower back)     Low back 3. SEVERITY: How bad is the pain?  (e.g., Scale 1-10; mild, moderate, or severe)     Severe when it occurred 4. PATTERN: Is the pain constant? (e.g., yes, no; constant, intermittent)      Intermittent, resolved after voiding 5. RADIATION: Does the pain shoot into your legs or somewhere else?     denies 6. CAUSE:  What do you think is causing the back pain?      Possible kidney stone 9. NEUROLOGIC SYMPTOMS: Do you have any weakness, numbness, or problems with bowel/bladder control?     denies 10. OTHER SYMPTOMS: Do you have any other symptoms? (e.g., fever, abdomen pain, burning with urination, blood in urine)       Pain in the LLQ prior to void,  denies N/V,  Protocols used: Back Pain-A-AH

## 2024-03-31 NOTE — Telephone Encounter (Signed)
 Called patient to schedule appt. Patient stated she felt better. No appt needed at this time.

## 2024-03-31 NOTE — Telephone Encounter (Signed)
 Please reach out to patient and schedule visit for evaluation.

## 2024-04-15 ENCOUNTER — Ambulatory Visit: Admitting: Pediatrics

## 2024-04-15 ENCOUNTER — Encounter: Payer: Self-pay | Admitting: Pediatrics

## 2024-04-15 VITALS — BP 134/83 | HR 65 | Temp 97.9°F | Wt 178.2 lb

## 2024-04-15 DIAGNOSIS — B0229 Other postherpetic nervous system involvement: Secondary | ICD-10-CM | POA: Diagnosis not present

## 2024-04-15 DIAGNOSIS — L659 Nonscarring hair loss, unspecified: Secondary | ICD-10-CM | POA: Insufficient documentation

## 2024-04-15 DIAGNOSIS — R7303 Prediabetes: Secondary | ICD-10-CM

## 2024-04-15 DIAGNOSIS — E785 Hyperlipidemia, unspecified: Secondary | ICD-10-CM

## 2024-04-15 NOTE — Assessment & Plan Note (Signed)
 Chronic with persistent facial numbness and pain. Gabapentin  provides partial relief. Consideration of pregabalin  due to previous efficacy. - Continue gabapentin  50 mg twice daily. - Consider switching to pregabalin  if symptoms worsen or gabapentin  becomes ineffective. - Monitor symptoms and adjust treatment as needed.

## 2024-04-15 NOTE — Assessment & Plan Note (Signed)
 A1c improved from 5.9 to 5.8. - Check A1c levels today. - Continue current lifestyle modifications. - Review A1c results and adjust treatment if necessary.

## 2024-04-15 NOTE — Assessment & Plan Note (Signed)
 Well-controlled with atorvastatin . Previous cholesterol levels normal. - Check cholesterol levels today. - Continue atorvastatin  as prescribed. - Review cholesterol results and adjust treatment if necessary.

## 2024-04-15 NOTE — Assessment & Plan Note (Signed)
 Mild nonscarring hair loss with increased shedding. Biotin and another supplement started. Dosage and product details pending. - Continue biotin supplementation. - Review supplement details once provided.

## 2024-04-15 NOTE — Progress Notes (Signed)
 Office Visit  BP 134/83   Pulse 65   Temp 97.9 F (36.6 C) (Oral)   Wt 178 lb 3.2 oz (80.8 kg)   SpO2 99%   BMI 27.91 kg/m    Subjective:    Patient ID: [name], female    DOB: 1955-10-21, 68 y.o.   MRN: [id number]  HPI: [name] is a 68 y.o. female  Chief Complaint  Patient presents with   Follow-up    Discussed the use of AI scribe software for clinical note transcription with the patient, who gave verbal consent to proceed.  History of Present Illness   [name] is a 68 year old female who presents with facial numbness and pain.  She experiences persistent facial numbness and occasional pain, with some involvement of the head. The symptoms have not improved and remain consistent. She is currently taking gabapentin  50 mg twice a day but feels that a previous medication may have been more effective.  She follows a Mediterranean diet and is mindful of her portion sizes. Despite her efforts, she has not lost the desired weight. Her cholesterol levels were previously checked, and she is on atorvastatin  for cholesterol management.  She has been using SkinCeuticals and Rhofade for a forehead issue, which she reports work well. However, she experienced a severe reaction to an acid product recommended by her dermatologist, which caused significant irritation. She has since discontinued the use of the acid and continues with Rhofade.  She mentions a right knee issue for which she attended physical therapy, but it has not improved. She has decided against further injections for this problem.  She is interested in methods to reduce cortisol levels, noting that she sometimes practices mindfulness and meditation.      Relevant past medical, surgical, family and social history reviewed and updated as indicated. Interim medical history since our last visit reviewed. Allergies and medications reviewed and updated.  ROS per HPI unless specifically indicated above      Objective:    BP 134/83   Pulse 65   Temp 97.9 F (36.6 C) (Oral)   Wt 178 lb 3.2 oz (80.8 kg)   SpO2 99%   BMI 27.91 kg/m   Wt Readings from Last 3 Encounters:  04/15/24 178 lb 3.2 oz (80.8 kg)  01/10/24 169 lb 12.8 oz (77 kg)  11/08/23 174 lb 9.6 oz (79.2 kg)     Physical Exam Constitutional:      Appearance: Normal appearance.  Pulmonary:     Effort: Pulmonary effort is normal.  Musculoskeletal:        General: Normal range of motion.  Skin:    Comments: Normal skin color  Neurological:     General: No focal deficit present.     Mental Status: She is alert. Mental status is at baseline.  Psychiatric:        Mood and Affect: Mood normal.        Behavior: Behavior normal.        Thought Content: Thought content normal.         04/15/2024    9:17 AM 01/10/2024    8:14 AM 11/08/2023    9:26 AM 10/17/2023    1:29 PM 10/04/2023    8:23 AM  Depression screen PHQ 2/9  Decreased Interest 0 0 0 0 0  Down, Depressed, Hopeless 0 0 0 0 0  PHQ - 2 Score 0 0 0 0 0  Altered sleeping 0 0 0 0 1  Tired,  decreased energy 0 0 0 0 0  Change in appetite 0 0 0 0 1  Feeling bad or failure about yourself  0 0 0 0 0  Trouble concentrating 0 0 0 0 0  Moving slowly or fidgety/restless 0 0 0 0 0  Suicidal thoughts 0 0 0 0 0  PHQ-9 Score 0 0 0 0 2  Difficult doing work/chores Not difficult at all Not difficult at all Not difficult at all Not difficult at all        04/15/2024    9:17 AM 01/10/2024    8:15 AM 11/08/2023    9:26 AM 10/17/2023    1:29 PM  GAD 7 : Generalized Anxiety Score  Nervous, Anxious, on Edge 0 0 0 0  Control/stop worrying 0 0 0 0  Worry too much - different things 0 0 0 0  Trouble relaxing 0 0 0 0  Restless 0 0 0 0  Easily annoyed or irritable 0 0 0 0  Afraid - awful might happen 0 0 0 0  Total GAD 7 Score 0 0 0 0  Anxiety Difficulty Not difficult at all Not difficult at all Not difficult at all Not difficult at all       Assessment & Plan:  Assessment &  Plan   Post herpetic neuralgia Assessment & Plan: Chronic with persistent facial numbness and pain. Gabapentin  provides partial relief. Consideration of pregabalin  due to previous efficacy. - Continue gabapentin  50 mg twice daily. - Consider switching to pregabalin  if symptoms worsen or gabapentin  becomes ineffective. - Monitor symptoms and adjust treatment as needed.   Hair thinning Assessment & Plan: Mild nonscarring hair loss with increased shedding. Biotin and another supplement started. Dosage and product details pending. - Continue biotin supplementation. - Review supplement details once provided.   Prediabetes Assessment & Plan: A1c improved from 5.9 to 5.8. - Check A1c levels today. - Continue current lifestyle modifications. - Review A1c results and adjust treatment if necessary.   Orders: -     Hemoglobin A1c  Hyperlipidemia, unspecified hyperlipidemia type Assessment & Plan: Well-controlled with atorvastatin . Previous cholesterol levels normal. - Check cholesterol levels today. - Continue atorvastatin  as prescribed. - Review cholesterol results and adjust treatment if necessary.  Orders: -     Lipid panel    Follow up plan: Return in about 3 months (around 07/15/2024).  Hadassah SHAUNNA Nett, MD   Approximately 30 minutes spent on patient encounter today including assessment, counseling, diagnosing, treatment plan development, and charting.

## 2024-04-16 LAB — LIPID PANEL
Chol/HDL Ratio: 2.4 ratio (ref 0.0–4.4)
Cholesterol, Total: 144 mg/dL (ref 100–199)
HDL: 59 mg/dL (ref >39)
LDL Chol Calc (NIH): 72 mg/dL (ref 0–99)
Triglycerides: 65 mg/dL (ref 0–149)
VLDL Cholesterol Cal: 13 mg/dL (ref 5–40)

## 2024-04-16 LAB — HEMOGLOBIN A1C
Est. average glucose Bld gHb Est-mCnc: 120 mg/dL
Hgb A1c MFr Bld: 5.8 % — ABNORMAL HIGH (ref 4.8–5.6)

## 2024-04-17 ENCOUNTER — Ambulatory Visit: Payer: Self-pay | Admitting: Pediatrics

## 2024-04-24 ENCOUNTER — Other Ambulatory Visit: Payer: Self-pay | Admitting: Pediatrics

## 2024-04-24 DIAGNOSIS — B0229 Other postherpetic nervous system involvement: Secondary | ICD-10-CM

## 2024-04-24 MED ORDER — PREGABALIN 50 MG PO CAPS: 50.0000 mg | ORAL_CAPSULE | Freq: Two times a day (BID) | ORAL | 2 refills | Status: DC

## 2024-04-24 NOTE — Telephone Encounter (Signed)
 Called patient and left a message for her to call back to get scheduled for a nurse visit for a weight check.

## 2024-04-24 NOTE — Progress Notes (Signed)
 Sending lyrica  refills per pt request.  Hadassah SHAUNNA Nett, MD

## 2024-04-29 NOTE — Telephone Encounter (Signed)
 Patient states she has decided to buy a scale in lieu of coming into office for weight checks.

## 2024-05-07 ENCOUNTER — Telehealth: Payer: Self-pay

## 2024-05-07 ENCOUNTER — Other Ambulatory Visit: Payer: Self-pay | Admitting: Pediatrics

## 2024-05-07 DIAGNOSIS — L659 Nonscarring hair loss, unspecified: Secondary | ICD-10-CM

## 2024-05-07 NOTE — Telephone Encounter (Unsigned)
 Copied from CRM #[id number]. Topic: Clinical - Request for Lab/Test Order >> May 07, 2024 11:59 AM Everette C wrote: Reason for CRM: The patient has called to request lab orders for testing their iron, vitamin d, thyroid and hormone levels. Please contact the patient further when possible. The patient shares that they previously requested labs on 05/03/24

## 2024-05-08 ENCOUNTER — Other Ambulatory Visit

## 2024-05-08 ENCOUNTER — Other Ambulatory Visit: Payer: Self-pay | Admitting: Pediatrics

## 2024-05-08 DIAGNOSIS — L659 Nonscarring hair loss, unspecified: Secondary | ICD-10-CM

## 2024-05-08 NOTE — Telephone Encounter (Signed)
 Pt had lab visit 05/08/24

## 2024-05-09 ENCOUNTER — Ambulatory Visit: Payer: Self-pay | Admitting: Pediatrics

## 2024-05-09 ENCOUNTER — Telehealth: Payer: Self-pay

## 2024-05-09 LAB — IRON AND TIBC
Iron Saturation: 22 % (ref 15–55)
Iron: 87 ug/dL (ref 27–139)
Total Iron Binding Capacity: 389 ug/dL (ref 250–450)
UIBC: 302 ug/dL (ref 118–369)

## 2024-05-09 LAB — VITAMIN D 25 HYDROXY (VIT D DEFICIENCY, FRACTURES): Vit D, 25-Hydroxy: 40.4 ng/mL (ref 30.0–100.0)

## 2024-05-09 LAB — THYROID PANEL WITH TSH
Free Thyroxine Index: 1.4 (ref 1.2–4.9)
T3 Uptake Ratio: 20 % — ABNORMAL LOW (ref 24–39)
T4, Total: 7.2 ug/dL (ref 4.5–12.0)
TSH: 1.72 u[IU]/mL (ref 0.450–4.500)

## 2024-05-09 LAB — FERRITIN: Ferritin: 91 ng/mL (ref 15–150)

## 2024-05-09 LAB — FSH/LH
FSH: 66.9 m[IU]/mL (ref 25.8–134.8)
LH: 24.4 m[IU]/mL (ref 7.7–58.5)

## 2024-05-09 NOTE — Telephone Encounter (Unsigned)
 Copied from CRM [id number]. Topic: General - Other >> May 09, 2024 11:58 AM Joesph NOVAK wrote: Reason for CRM: Patient is calling to be seen today to go over lab results. She is very persistent on getting in today with her provider. I spoke to CAL and was advised she may be able to be seen today. Nurse will call her.

## 2024-05-09 NOTE — Telephone Encounter (Signed)
 Copied from CRM [id number]. Topic: General - Other >> May 09, 2024 11:58 AM Joesph NOVAK wrote: Reason for CRM: Patient is calling to be seen today to go over lab results. She is very persistent on getting in today with her provider. I spoke to CAL and was advised she may be able to be seen today. Nurse will call her. >> May 09, 2024  4:07 PM Cassius RAMAN wrote: Contacted patient, who stated that she wants an appointment as soon as possible, She stated that although Dr. Herold advised her lab results were normal, she read online that her lab values could be contributing to her hair loss. The patient requested an appointment on Tuesday, 05/13/24. I informed her that no appointments are currently available that day but added her to the waitlist to be contacted if a cancellation occurs. The patient verbalized understanding and agreement with the plan.  >> May 09, 2024  3:28 PM Zebedee SAUNDERS wrote: Pt is upset because no one has called her for an appointment today. Pt wants a call back at [phone].

## 2024-05-12 NOTE — Telephone Encounter (Signed)
 Called patient and left a message for her to call back to get scheduled.

## 2024-05-13 ENCOUNTER — Ambulatory Visit: Admitting: Pediatrics

## 2024-05-13 ENCOUNTER — Encounter: Payer: Self-pay | Admitting: Pediatrics

## 2024-05-13 VITALS — BP 111/71 | HR 62 | Temp 98.0°F | Ht 67.0 in | Wt 181.0 lb

## 2024-05-13 DIAGNOSIS — E663 Overweight: Secondary | ICD-10-CM

## 2024-05-13 DIAGNOSIS — L659 Nonscarring hair loss, unspecified: Secondary | ICD-10-CM

## 2024-05-13 NOTE — Telephone Encounter (Signed)
 Patient seen today with PCP 05/13/2024.

## 2024-05-13 NOTE — Progress Notes (Signed)
 Office Visit  BP 111/71   Pulse 62   Temp 98 F (36.7 C) (Oral)   Ht 5' 7 (1.702 m)   Wt 181 lb (82.1 kg)   SpO2 100%   BMI 28.35 kg/m    Subjective:    Patient ID: [name], female    DOB: 24-Aug-1955, 68 y.o.   MRN: [id number]  HPI: [name] is a 68 y.o. female  Chief Complaint  Patient presents with   labs result     #hair thinning Wanted to discuss recent lab results Continues to have more hair loss, working with hair dresser to regulate  Relevant past medical, surgical, family and social history reviewed and updated as indicated. Interim medical history since our last visit reviewed. Allergies and medications reviewed and updated.  ROS per HPI unless specifically indicated above     Objective:    BP 111/71   Pulse 62   Temp 98 F (36.7 C) (Oral)   Ht 5' 7 (1.702 m)   Wt 181 lb (82.1 kg)   SpO2 100%   BMI 28.35 kg/m   Wt Readings from Last 3 Encounters:  05/13/24 181 lb (82.1 kg)  04/15/24 178 lb 3.2 oz (80.8 kg)  01/10/24 169 lb 12.8 oz (77 kg)     Physical Exam Constitutional:      Appearance: Normal appearance.  Pulmonary:     Effort: Pulmonary effort is normal.  Musculoskeletal:        General: Normal range of motion.  Skin:    Comments: Normal skin color  Neurological:     General: No focal deficit present.     Mental Status: She is alert. Mental status is at baseline.  Psychiatric:        Mood and Affect: Mood normal.        Behavior: Behavior normal.        Thought Content: Thought content normal.         04/15/2024    9:17 AM 01/10/2024    8:14 AM 11/08/2023    9:26 AM 10/17/2023    1:29 PM 10/04/2023    8:23 AM  Depression screen PHQ 2/9  Decreased Interest 0 0 0 0 0  Down, Depressed, Hopeless 0 0 0 0 0  PHQ - 2 Score 0 0 0 0 0  Altered sleeping 0 0 0 0 1  Tired, decreased energy 0 0 0 0 0  Change in appetite 0 0 0 0 1  Feeling bad or failure about yourself  0 0 0 0 0  Trouble concentrating 0 0 0 0 0  Moving  slowly or fidgety/restless 0 0 0 0 0  Suicidal thoughts 0 0 0 0 0  PHQ-9 Score 0 0 0 0 2  Difficult doing work/chores Not difficult at all Not difficult at all Not difficult at all Not difficult at all        04/15/2024    9:17 AM 01/10/2024    8:15 AM 11/08/2023    9:26 AM 10/17/2023    1:29 PM  GAD 7 : Generalized Anxiety Score  Nervous, Anxious, on Edge 0 0 0 0  Control/stop worrying 0 0 0 0  Worry too much - different things 0 0 0 0  Trouble relaxing 0 0 0 0  Restless 0 0 0 0  Easily annoyed or irritable 0 0 0 0  Afraid - awful might happen 0 0 0 0  Total GAD 7 Score 0 0 0 0  Anxiety Difficulty Not difficult at all Not difficult at all Not difficult at all Not difficult at all       Assessment & Plan:  Assessment & Plan   Hair thinning Assessment & Plan: Reviewed recent lab results as part of hair thinning w/u. All normal. Plans to work with hair dresser on products. Of note, do not see patches or particular distribution of hair thinning. Recommend rogaine. Considered finasteride though may have other excessive hair growth.     Follow up plan: Scheduled.  Hadassah SHAUNNA Nett, MD

## 2024-05-14 NOTE — Telephone Encounter (Signed)
 Called patient to get her scheduled, but she had already been scheduled. The message had not been cleared out.

## 2024-05-19 ENCOUNTER — Encounter: Payer: Self-pay | Admitting: Pediatrics

## 2024-05-19 NOTE — Assessment & Plan Note (Signed)
 Reviewed recent lab results as part of hair thinning w/u. All normal. Plans to work with hair dresser on products. Of note, do not see patches or particular distribution of hair thinning. Recommend rogaine. Considered finasteride though may have other excessive hair growth.

## 2024-07-10 ENCOUNTER — Encounter: Payer: Self-pay | Admitting: Pediatrics

## 2024-07-10 ENCOUNTER — Ambulatory Visit: Admitting: Pediatrics

## 2024-07-10 VITALS — BP 105/69 | HR 61 | Temp 98.9°F | Ht 67.0 in | Wt 179.8 lb

## 2024-07-10 DIAGNOSIS — F418 Other specified anxiety disorders: Secondary | ICD-10-CM

## 2024-07-10 DIAGNOSIS — M810 Age-related osteoporosis without current pathological fracture: Secondary | ICD-10-CM

## 2024-07-10 DIAGNOSIS — B0229 Other postherpetic nervous system involvement: Secondary | ICD-10-CM | POA: Diagnosis not present

## 2024-07-10 DIAGNOSIS — E663 Overweight: Secondary | ICD-10-CM

## 2024-07-10 DIAGNOSIS — E785 Hyperlipidemia, unspecified: Secondary | ICD-10-CM

## 2024-07-10 DIAGNOSIS — L719 Rosacea, unspecified: Secondary | ICD-10-CM | POA: Diagnosis not present

## 2024-07-10 MED ORDER — PREGABALIN 50 MG PO CAPS: 50.0000 mg | ORAL_CAPSULE | Freq: Two times a day (BID) | ORAL | 2 refills | Status: AC

## 2024-07-10 MED ORDER — ATORVASTATIN CALCIUM 10 MG PO TABS: 10.0000 mg | ORAL_TABLET | Freq: Every day | ORAL | 3 refills | Status: AC

## 2024-07-10 MED ORDER — ALENDRONATE SODIUM 70 MG PO TABS: 70.0000 mg | ORAL_TABLET | ORAL | 3 refills | Status: AC

## 2024-07-10 MED ORDER — PROPRANOLOL HCL 20 MG PO TABS: 20.0000 mg | ORAL_TABLET | Freq: Every day | ORAL | 1 refills | Status: AC | PRN

## 2024-07-10 NOTE — Progress Notes (Unsigned)
 Office Visit  BP 105/69   Pulse 61   Temp 98.9 F (37.2 C) (Oral)   Ht 5' 7 (1.702 m)   Wt 179 lb 12.8 oz (81.6 kg)   SpO2 98%   BMI 28.16 kg/m    Subjective:    Patient ID: [name], female    DOB: 11-Sep-1955, 68 y.o.   MRN: [id number]  HPI: [name] is a 68 y.o. female  Chief Complaint  Patient presents with   Hyperlipidemia   Osteoporosis    Discussed the use of AI scribe software for clinical note transcription with the patient, who gave verbal consent to proceed.  History of Present Illness   [name] is a 68 year old female who presents for weight management and itching.  She is concerned about her weight and wants to lose more despite recent improvements in her health metrics. She is mindful of her sodium and sugar intake and is motivated by an upcoming destination wedding in [place] at the end of April.  She has prediabetes and her last A1c was checked in September. She prefers to wait until her next visit to recheck it, as the A1c reflects an average over three months. She is usually recommended to check it every six months.  She experiences itching, which she associates with forgetting to take the second dose of her medication, pregabalin , which she takes twice a day. She has consulted a dermatologist in [place] who advised her to continue her current treatment. She uses a cream for rosacea, which she orders from a compounding company, though she cannot recall the exact name of the medication.  Her current medications include pregabalin , atorvastatin , propranolol , and Fosamax . She confirms she is not taking gabapentin .     Relevant past medical, surgical, family and social history reviewed and updated as indicated. Interim medical history since our last visit reviewed. Allergies and medications reviewed and updated.  ROS per HPI unless specifically indicated above     Objective:    BP 105/69   Pulse 61   Temp 98.9 F (37.2 C) (Oral)   Ht 5'  7 (1.702 m)   Wt 179 lb 12.8 oz (81.6 kg)   SpO2 98%   BMI 28.16 kg/m   Wt Readings from Last 3 Encounters:  07/10/24 179 lb 12.8 oz (81.6 kg)  05/13/24 181 lb (82.1 kg)  04/15/24 178 lb 3.2 oz (80.8 kg)     Physical Exam Constitutional:      Appearance: Normal appearance.  Pulmonary:     Effort: Pulmonary effort is normal.  Musculoskeletal:        General: Normal range of motion.  Skin:    Comments: Normal skin color  Neurological:     General: No focal deficit present.     Mental Status: She is alert. Mental status is at baseline.  Psychiatric:        Mood and Affect: Mood normal.        Behavior: Behavior normal.        Thought Content: Thought content normal.         07/10/2024    8:48 AM 04/15/2024    9:17 AM 01/10/2024    8:14 AM 11/08/2023    9:26 AM 10/17/2023    1:29 PM  Depression screen PHQ 2/9  Decreased Interest 0 0 0 0 0  Down, Depressed, Hopeless 0 0 0 0 0  PHQ - 2 Score 0 0 0 0 0  Altered sleeping 0 0 0  0 0  Tired, decreased energy 0 0 0 0 0  Change in appetite 0 0 0 0 0  Feeling bad or failure about yourself  0 0 0 0 0  Trouble concentrating 0 0 0 0 0  Moving slowly or fidgety/restless 0 0 0 0 0  Suicidal thoughts 0 0 0 0 0  PHQ-9 Score 0 0  0  0  0   Difficult doing work/chores Not difficult at all Not difficult at all Not difficult at all Not difficult at all Not difficult at all     Data saved with a previous flowsheet row definition       07/10/2024    8:48 AM 04/15/2024    9:17 AM 01/10/2024    8:15 AM 11/08/2023    9:26 AM  GAD 7 : Generalized Anxiety Score  Nervous, Anxious, on Edge 0 0 0 0  Control/stop worrying 0 0 0 0  Worry too much - different things 0 0 0 0  Trouble relaxing 0 0 0 0  Restless 0 0 0 0  Easily annoyed or irritable 0 0 0 0  Afraid - awful might happen 0 0 0 0  Total GAD 7 Score 0 0 0 0  Anxiety Difficulty Not difficult at all Not difficult at all Not difficult at all Not difficult at all       Assessment &  Plan:  Assessment & Plan   Overweight (BMI 25.0-29.9)  Osteoporosis, post-menopausal -     Alendronate  Sodium; Take 1 tablet (70 mg total) by mouth once a week.  Dispense: 12 tablet; Refill: 3  Hyperlipidemia, unspecified hyperlipidemia type -     Atorvastatin  Calcium ; Take 1 tablet (10 mg total) by mouth daily.  Dispense: 90 tablet; Refill: 3  Performance anxiety -     Propranolol  HCl; Take 1 tablet (20 mg total) by mouth daily as needed.  Dispense: 30 tablet; Refill: 1  Rosacea  Post herpetic neuralgia -     Pregabalin ; Take 1 capsule (50 mg total) by mouth 2 (two) times daily.  Dispense: 60 capsule; Refill: 2     Assessment and Plan    Obesity Discussed lifestyle changes and GLP-1 agonists for weight management, including benefits and financial considerations. She is interested in lifestyle changes and may consider medication later. - Continue lifestyle modifications for weight management. - Consider GLP-1 agonists if lifestyle changes are insufficient. - Schedule follow-up with Doctor Vicci in March or April to reassess weight management strategies.  Chronic pruritus Managed with pregabalin . Dermatologist recommended topical treatment for rosacea. Pruritus may be related to post-shingles or age-related rosacea. - Continue pregabalin  for pruritus management. - Follow dermatologist's recommendation for topical treatment for rosacea.  Postmenopausal osteoporosis Managed with Fosamax . - Continue Fosamax  for osteoporosis management.  Hyperlipidemia Managed with atorvastatin . - Continue atorvastatin  for hyperlipidemia management.  General Health Maintenance Blood pressure well-controlled. Managing sodium and sugar intake effectively. A1c to be re-evaluated in March. - Continue current management of sodium and sugar intake. - Re-evaluate A1c in March.         Follow up plan: Return in about 3 months (around 10/08/2024) for Physical.  Jeanne SHAUNNA Nett, MD

## 2024-07-10 NOTE — Patient Instructions (Signed)
 Glp1 agonist - wegovy and zepbound (semaglutide and trizepatide) Weight medications: Injectable medications: GLP1 agonist like ozempic, wegovy, zepbound, mounjaro  Once weekly injections Most common side effects: nausea, diarrhea, abdominal pain, constipation, acid reflux Higher risk for pancreatitis (inflammation of pancreas) - if developed this while on the medication would stop it Contraindications: history of medullary thyroid  cancer, MEN2 disorders, pregnancy Depending on which injection, can see between 14-26% body weight loss

## 2024-07-13 ENCOUNTER — Encounter: Payer: Self-pay | Admitting: Pediatrics

## 2024-07-13 DIAGNOSIS — F418 Other specified anxiety disorders: Secondary | ICD-10-CM | POA: Insufficient documentation

## 2024-07-13 NOTE — Assessment & Plan Note (Signed)
 Well managed with propranolol  20mg  PRN. CTM.

## 2024-07-13 NOTE — Assessment & Plan Note (Signed)
 Sent atorva refills. The 10-year ASCVD risk score (Arnett DK, et al., 2019) is: 6%   Values used to calculate the score:     Age: 68 years     Clinically relevant sex: Female     Is Non-Hispanic African American: No     Diabetic: No     Tobacco smoker: No     Systolic Blood Pressure: 105 mmHg     Is BP treated: Yes     HDL Cholesterol: 59 mg/dL     Total Cholesterol: 144 mg/dL

## 2024-07-13 NOTE — Assessment & Plan Note (Signed)
 Managed with pregabalin . Dermatologist recommended topical treatment for rosacea. Pruritus may be related to post-shingles or age-related rosacea. - Continue pregabalin  for pruritus management. - Follow dermatologist's recommendation for topical treatment for rosacea.

## 2024-07-13 NOTE — Assessment & Plan Note (Signed)
 Discussed lifestyle changes and GLP-1 agonists for weight management, including benefits and financial considerations. She is interested in lifestyle changes and may consider medication later. - Continue lifestyle modifications for weight management. - Consider GLP-1 agonists if lifestyle changes are insufficient.

## 2024-07-13 NOTE — Assessment & Plan Note (Signed)
 On fosamax . CTM.

## 2024-07-13 NOTE — Assessment & Plan Note (Signed)
 Following with dermatology

## 2024-07-14 ENCOUNTER — Ambulatory Visit: Payer: Self-pay

## 2024-07-14 NOTE — Telephone Encounter (Signed)
 FYI Only or Action Required?: Action required by provider: update on patient condition and Requesting albuterol inhaler- please call or send mychart with response.  Patient was last seen in primary care on 07/10/2024 by Herold Hadassah SQUIBB, MD.  Called Nurse Triage reporting URI.  Symptoms began several days ago.  Interventions attempted: OTC medications: Vicks sinus and flu.  Symptoms are:  gradually worsening.  Triage Disposition: Home Care  Patient/caregiver understands and will follow disposition?: Yes  Message from [facility] sent at 07/14/2024 11:38 AM EST  Summary: Cold flu symptoms, seeking appt today. Declined next available.   Reason for Triage: Congestion in the nose, not much of a fever, bad cough, has lost voice.  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 Has some tightness to her cough. Mostly dry hacky, with occasional white, beige sputum.   Vicks cough and flu, hot tea with honey  Right nare blocked, BP has been good   Thinking she might need a bronchodilator. Asking if albuterol can be sent into her pharmacy (confirmed and updated). She is thinking viral but wants to ensure she is able to perform in a couple days.   1. ONSET: When did the nasal discharge start?      2 days  2. AMOUNT: How much discharge is there?      Mild, moderate 3. COUGH: Do you have a cough? If Yes, ask: Describe the color of your mucus. (e.g., clear, white, yellow, green)     Frequent cough with white or beige sputum  4. RESPIRATORY DISTRESS: Describe your breathing.      Denies SOB,  5. FEVER: Do you have a fever? If Yes, ask: What is  your temperature, how was it measured, and when did it start?     Denies much of one 6. SEVERITY: Overall, how bad are you feeling right now? (e.g., doesn't interfere with normal activities, staying home from school/work, staying in bed)      Trying to sleep  7. OTHER SYMPTOMS: Do you have any other symptoms? (e.g., earache, mouth sores, sore throat, wheezing)     Scratchy throat, hoarse voice,  Protocols used: Common Cold-A-AH

## 2024-07-15 NOTE — Telephone Encounter (Signed)
 Patient returned call and did not want to schedule

## 2024-07-15 NOTE — Telephone Encounter (Signed)
 Called patient and left a message to call back to get scheduled for tomorrow if available

## 2024-07-15 NOTE — Telephone Encounter (Signed)
 Appt- I can see her tomorrow but not today

## 2024-07-15 NOTE — Telephone Encounter (Unsigned)
 Copied from CRM [id number]. Topic: General - Other >> Jul 15, 2024 10:53 AM Delon DASEN wrote: Reason for CRM: patient returning call from office- declined to schedule

## 2024-07-16 ENCOUNTER — Ambulatory Visit: Admitting: Nurse Practitioner

## 2024-07-16 ENCOUNTER — Encounter: Payer: Self-pay | Admitting: Nurse Practitioner

## 2024-07-16 VITALS — BP 128/76 | HR 73 | Temp 98.2°F | Resp 18 | Ht 67.01 in | Wt 180.2 lb

## 2024-07-16 DIAGNOSIS — R051 Acute cough: Secondary | ICD-10-CM

## 2024-07-16 DIAGNOSIS — J069 Acute upper respiratory infection, unspecified: Secondary | ICD-10-CM

## 2024-07-16 LAB — POC COVID19/FLU A&B COMBO
Covid Antigen, POC: NEGATIVE
Influenza A Antigen, POC: NEGATIVE
Influenza B Antigen, POC: NEGATIVE

## 2024-07-16 LAB — POC COVID19 BINAXNOW: SARS Coronavirus 2 Ag: NEGATIVE

## 2024-07-16 LAB — VERITOR FLU A/B WAIVED
Influenza A: NEGATIVE
Influenza B: NEGATIVE

## 2024-07-16 NOTE — Addendum Note (Signed)
 Addended by: Davianna Deutschman T on: 07/16/2024 09:39 AM   Modules accepted: Orders

## 2024-07-16 NOTE — Progress Notes (Signed)
 "  BP 128/76 (BP Location: Left Arm, Patient Position: Sitting, Cuff Size: Normal)   Pulse 73   Temp 98.2 F (36.8 C) (Oral)   Resp 18   Ht 5' 7.01 (1.702 m)   Wt 180 lb 3.2 oz (81.7 kg)   SpO2 97%   BMI 28.22 kg/m    Subjective:    Patient ID: [name], female    DOB: 1956/06/19, 68 y.o.   MRN: [id number]  HPI: [name] is a 68 y.o. female  Chief Complaint  Patient presents with   Cough    For the past three days she has had a cough, nasal congestion, dry throat. Temp was up and down last night. Has been spitting up white phlegm.   UPPER RESPIRATORY TRACT INFECTION Symptoms started 3 days ago with stuffy nose and cough. With her coughing feels she may have pulled something in her back, upper left. Recently took cold medication. She is supposed to see her grandson on Friday. Fever: yes x one last night Cough: yes Shortness of breath: no Wheezing: no Chest pain: no Chest tightness: no Chest congestion: no Nasal congestion: yes Runny nose: yes Post nasal drip: yes Sneezing: no Sore throat: no Swollen glands: no Sinus pressure: no Headache: no Face pain: no Toothache: no Ear pain: none Ear pressure: none Eyes red/itching:no Eye drainage/crusting: no  Vomiting: no Rash: no Fatigue: yes Sick contacts: yes at the [facility] Strep contacts: no  Context: fluctuating Recurrent sinusitis: no Relief with OTC cold/cough medications: yes  Treatments attempted: cold/sinus    Relevant past medical, surgical, family and social history reviewed and updated as indicated. Interim medical history since our last visit reviewed. Allergies and medications reviewed and updated.  Review of Systems  Constitutional:  Positive for fatigue. Negative for activity change, appetite change, chills and fever.  HENT:  Positive for congestion, postnasal drip and rhinorrhea. Negative for ear discharge, ear pain, facial swelling, sinus pressure, sinus pain, sneezing, sore throat and voice  change.   Respiratory:  Positive for cough. Negative for chest tightness, shortness of breath and wheezing.   Cardiovascular:  Negative for chest pain, palpitations and leg swelling.  Gastrointestinal: Negative.   Musculoskeletal:  Positive for arthralgias.  Neurological: Negative.   Psychiatric/Behavioral: Negative.      Per HPI unless specifically indicated above     Objective:    BP 128/76 (BP Location: Left Arm, Patient Position: Sitting, Cuff Size: Normal)   Pulse 73   Temp 98.2 F (36.8 C) (Oral)   Resp 18   Ht 5' 7.01 (1.702 m)   Wt 180 lb 3.2 oz (81.7 kg)   SpO2 97%   BMI 28.22 kg/m   Wt Readings from Last 3 Encounters:  07/16/24 180 lb 3.2 oz (81.7 kg)  07/10/24 179 lb 12.8 oz (81.6 kg)  05/13/24 181 lb (82.1 kg)    Physical Exam Vitals and nursing note reviewed.  Constitutional:      General: She is awake. She is not in acute distress.    Appearance: She is well-developed and well-groomed. She is obese. She is not ill-appearing or toxic-appearing.  HENT:     Head: Normocephalic.     Right Ear: Hearing, ear canal and external ear normal. A middle ear effusion is present. Tympanic membrane is not injected or perforated.     Left Ear: Hearing, ear canal and external ear normal. A middle ear effusion is present. Tympanic membrane is not injected or perforated.     Nose: Rhinorrhea  present. Rhinorrhea is clear.     Right Sinus: No maxillary sinus tenderness or frontal sinus tenderness.     Left Sinus: No maxillary sinus tenderness or frontal sinus tenderness.     Mouth/Throat:     Mouth: Mucous membranes are moist.     Pharynx: Posterior oropharyngeal erythema (mild) and postnasal drip present. No pharyngeal swelling or oropharyngeal exudate.  Eyes:     General: Lids are normal.        Right eye: No discharge.        Left eye: No discharge.     Conjunctiva/sclera: Conjunctivae normal.     Pupils: Pupils are equal, round, and reactive to light.  Neck:      Thyroid : No thyromegaly.     Vascular: No carotid bruit.  Cardiovascular:     Rate and Rhythm: Normal rate and regular rhythm.     Heart sounds: Normal heart sounds. No murmur heard.    No gallop.  Pulmonary:     Effort: Pulmonary effort is normal. No accessory muscle usage or respiratory distress.     Breath sounds: Normal breath sounds. No decreased breath sounds, wheezing or rales.     Comments: Lungs overall clear throughout on exam. Abdominal:     General: Bowel sounds are normal.     Palpations: Abdomen is soft. There is no hepatomegaly or splenomegaly.  Musculoskeletal:     Cervical back: Normal range of motion and neck supple.     Right lower leg: No edema.     Left lower leg: No edema.     Comments: No tenderness on palpation of upper back. No rashes.   Lymphadenopathy:     Head:     Right side of head: Submandibular and tonsillar adenopathy present. No submental, preauricular or posterior auricular adenopathy.     Left side of head: Submandibular and tonsillar adenopathy present. No submental, preauricular or posterior auricular adenopathy.     Cervical: No cervical adenopathy.  Skin:    General: Skin is warm and dry.  Neurological:     Mental Status: She is alert and oriented to person, place, and time.  Psychiatric:        Attention and Perception: Attention normal.        Mood and Affect: Mood normal.        Speech: Speech normal.        Behavior: Behavior normal. Behavior is cooperative.        Thought Content: Thought content normal.     Results for orders placed or performed in visit on 07/16/24  POC COVID-19   Collection Time: 07/16/24  8:49 AM  Result Value Ref Range   SARS Coronavirus 2 Ag Negative Negative      Assessment & Plan:   Problem List Items Addressed This Visit       Respiratory   URI (upper respiratory infection) - Primary   Acute for 3 days with symptoms. Covid and Flu testing negative today in office. Discussed with her that her  symptoms are most consistent with a viral upper respiratory infection and these usually run their course in 5-7 days. Unfortunately, antibiotics don't work against viruses and just increase risk of other issues such as diarrhea, yeast infections, and resistant infections. Offered CXR for reassurance due to upper back discomfort, suspect this may be more muscular in nature. She declined at this time. Discussed with her if she is not feeling better past day 7, next week, then to alert PCP and  can send in abx then.  No red flags on exam. Would advise her not to go to see daughter unless 100% better since she is pregnant. - Increased rest - Increasing Fluids - Acetaminophen as needed for fever/pain.  - Salt water gargling, chloraseptic spray and throat lozenges - OTC Coricidin - Mucinex.  - Saline sinus flushes or a neti pot.  - Humidifying the air.       Relevant Orders   POC Covid19/Flu A&B Antigen   POC COVID-19 (Completed)   Veritor Flu A/B Waived     Follow up plan: Return if symptoms worsen or fail to improve.      "

## 2024-07-16 NOTE — Assessment & Plan Note (Signed)
 Acute for 3 days with symptoms. Covid and Flu testing negative today in office. Discussed with her that her symptoms are most consistent with a viral upper respiratory infection and these usually run their course in 5-7 days. Unfortunately, antibiotics don't work against viruses and just increase risk of other issues such as diarrhea, yeast infections, and resistant infections. Offered CXR for reassurance due to upper back discomfort, suspect this may be more muscular in nature. She declined at this time. Discussed with her if she is not feeling better past day 7, next week, then to alert PCP and can send in abx then.  No red flags on exam. Would advise her not to go to see daughter unless 100% better since she is pregnant. - Increased rest - Increasing Fluids - Acetaminophen as needed for fever/pain.  - Salt water gargling, chloraseptic spray and throat lozenges - OTC Coricidin - Mucinex.  - Saline sinus flushes or a neti pot.  - Humidifying the air.

## 2024-07-16 NOTE — Patient Instructions (Signed)

## 2024-07-21 ENCOUNTER — Telehealth: Payer: Self-pay

## 2024-07-21 ENCOUNTER — Telehealth: Payer: Self-pay | Admitting: Pediatrics

## 2024-07-21 ENCOUNTER — Encounter: Payer: Self-pay | Admitting: Nurse Practitioner

## 2024-07-21 MED ORDER — AMOXICILLIN-POT CLAVULANATE 875-125 MG PO TABS: 1.0000 | ORAL_TABLET | Freq: Two times a day (BID) | ORAL | 0 refills | Status: AC

## 2024-07-21 MED ORDER — PREDNISONE 20 MG PO TABS: 40.0000 mg | ORAL_TABLET | Freq: Every day | ORAL | 0 refills | Status: AC

## 2024-07-21 NOTE — Telephone Encounter (Signed)
 Copied from CRM #[id number]. Topic: Appointments - Appointment Scheduling >> Jul 18, 2024  3:06 PM Antony RAMAN wrote: Patient is calling about a cough that has not went away, dr valerio said to give her a call back if its not went away and she could see her soon. I dont see any appointments for her until Jan please advise [phone]

## 2024-07-21 NOTE — Telephone Encounter (Signed)
 Noted

## 2024-07-21 NOTE — Telephone Encounter (Signed)
 Patient has been called and a message left for them to return the call to the office. Ok for E2C2 to review if/when they return the call. Please do not transfer to CAL rather send a CRM if needed only.  Please advise patient rx's have been sent to pharmacy and she should let us  know if symptoms do not resolve over that time.

## 2024-07-21 NOTE — Telephone Encounter (Signed)
 Returned call to patient. She says based on Shingles she will be hold on the Prednisone  but is also not convinced she will take the antibiotic if she really needs to but her brother advised her not to take if she could.

## 2024-07-21 NOTE — Telephone Encounter (Signed)
 Copied from CRM #[id number]. Topic: Clinical - Medication Question >> Jul 21, 2024 10:40 AM Antony RAMAN wrote: Reason for CRM: please call patient, she mentioned she can't take a medication that was sent in for her. When asked which one she said it doesn't matter she just wants a call from someone in office [phone]

## 2024-08-07 ENCOUNTER — Ambulatory Visit (INDEPENDENT_AMBULATORY_CARE_PROVIDER_SITE_OTHER): Admitting: Nurse Practitioner

## 2024-08-07 ENCOUNTER — Encounter: Payer: Self-pay | Admitting: Nurse Practitioner

## 2024-08-07 VITALS — BP 116/79 | HR 87 | Temp 98.0°F | Ht 67.01 in | Wt 178.6 lb

## 2024-08-07 DIAGNOSIS — R051 Acute cough: Secondary | ICD-10-CM

## 2024-08-07 MED ORDER — AZITHROMYCIN 250 MG PO TABS: ORAL_TABLET | ORAL | 0 refills | Status: AC

## 2024-08-07 MED ORDER — FLUTICASONE PROPIONATE 50 MCG/ACT NA SUSP: 2.0000 | Freq: Every day | NASAL | 6 refills | Status: AC

## 2024-08-07 NOTE — Progress Notes (Signed)
 "  BP 116/79 (BP Location: Left Arm, Patient Position: Sitting, Cuff Size: Normal)   Pulse 87   Temp 98 F (36.7 C) (Oral)   Ht 5' 7.01 (1.702 m)   Wt 178 lb 9.6 oz (81 kg)   SpO2 99%   BMI 27.97 kg/m    Subjective:    Patient ID: [name], female    DOB: [birth year], 69 y.o.   MRN: [id number]  HPI: [name] is a 69 y.o. female  Chief Complaint  Patient presents with   Ear Pain   Dizziness   Patient presents to clinic with complaints of a bad head cold before christmas.  She was negative for flu and COVID.  Symptoms had resolved.  But then this morning she woke up with her left side congested.  She had some dizziness this morning.  Dizziness has mostly subsided.  No dizziness with standing.  It was after she rolled over in bed.  Denies headaches, fevers Does have slight ear pain on the left side. She didn't end up taking the augmentin  and didn't feel like she needed it.      Relevant past medical, surgical, family and social history reviewed and updated as indicated. Interim medical history since our last visit reviewed. Allergies and medications reviewed and updated.  Review of Systems  Constitutional:  Negative for fever.  HENT:  Positive for congestion and ear pain.   Neurological:  Positive for dizziness. Negative for headaches.    Per HPI unless specifically indicated above     Objective:    BP 116/79 (BP Location: Left Arm, Patient Position: Sitting, Cuff Size: Normal)   Pulse 87   Temp 98 F (36.7 C) (Oral)   Ht 5' 7.01 (1.702 m)   Wt 178 lb 9.6 oz (81 kg)   SpO2 99%   BMI 27.97 kg/m   Wt Readings from Last 3 Encounters:  08/07/24 178 lb 9.6 oz (81 kg)  07/16/24 180 lb 3.2 oz (81.7 kg)  07/10/24 179 lb 12.8 oz (81.6 kg)    Physical Exam Vitals and nursing note reviewed.  Constitutional:      General: She is not in acute distress.    Appearance: Normal appearance. She is normal weight. She is not ill-appearing, toxic-appearing or diaphoretic.   HENT:     Head: Normocephalic.     Right Ear: External ear normal. A middle ear effusion is present.     Left Ear: External ear normal. A middle ear effusion is present.     Nose: Nose normal.     Mouth/Throat:     Mouth: Mucous membranes are moist.     Pharynx: Oropharynx is clear.  Eyes:     General:        Right eye: No discharge.        Left eye: No discharge.     Extraocular Movements: Extraocular movements intact.     Conjunctiva/sclera: Conjunctivae normal.     Pupils: Pupils are equal, round, and reactive to light.  Cardiovascular:     Rate and Rhythm: Normal rate and regular rhythm.     Heart sounds: No murmur heard. Pulmonary:     Effort: Pulmonary effort is normal. No respiratory distress.     Breath sounds: Normal breath sounds. No wheezing or rales.  Musculoskeletal:     Cervical back: Normal range of motion and neck supple.  Skin:    General: Skin is warm and dry.     Capillary Refill: Capillary refill takes  less than 2 seconds.  Neurological:     General: No focal deficit present.     Mental Status: She is alert and oriented to person, place, and time. Mental status is at baseline.  Psychiatric:        Mood and Affect: Mood normal.        Behavior: Behavior normal.        Thought Content: Thought content normal.        Judgment: Judgment normal.     Results for orders placed or performed in visit on 07/16/24  Veritor Flu A/B Waived   Collection Time: 07/16/24  8:34 AM  Result Value Ref Range   Influenza A Negative Negative   Influenza B Negative Negative  POC COVID-19   Collection Time: 07/16/24  8:49 AM  Result Value Ref Range   SARS Coronavirus 2 Ag Negative Negative  POC Covid19/Flu A&B Antigen   Collection Time: 07/16/24  9:38 AM  Result Value Ref Range   Influenza A Antigen, POC Negative Negative   Influenza B Antigen, POC Negative Negative   Covid Antigen, POC Negative Negative      Assessment & Plan:   Problem List Items Addressed This  Visit   None Visit Diagnoses       Acute cough    -  Primary   Suspect symptoms are related to lingering URI. Will treat with Azithromycin  due to ongoing symptoms. Recommend Zyrtec and flonase . Follow up if not improved.        Follow up plan: Return if symptoms worsen or fail to improve.      "

## 2024-09-10 ENCOUNTER — Encounter: Admitting: Family Medicine
